# Patient Record
Sex: Male | Born: 1955
Health system: Western US, Academic
[De-identification: ages and names within clinical notes are randomized; demographics above are authoritative.]

## PROBLEM LIST (undated history)

## (undated) HISTORY — PX: EYE SURGERY: SHX253

---

## 2004-12-15 ENCOUNTER — Encounter: Admission: RE | Admit: 2004-12-15 | Discharge: 2004-12-15 | Payer: Self-pay | Admitting: Gastroenterology

## 2006-08-29 IMAGING — US US ABDOMEN COMPLETE
1 series · 14 of 25 positions shown · non-contrast
Comparison: None.

CLINICAL DATA: Elevated liver function studies.  
 COMPLETE ABDOMINAL ULTRASOUND:
TECHNIQUE: Complete abdominal ultrasound examination was performed including evaluation of the liver, gallbladder, bile ducts, pancreas, kidneys, spleen, IVC, and abdominal aorta.

[Series 1: unknown · 14 of 57 slices shown]
[im 1/57]
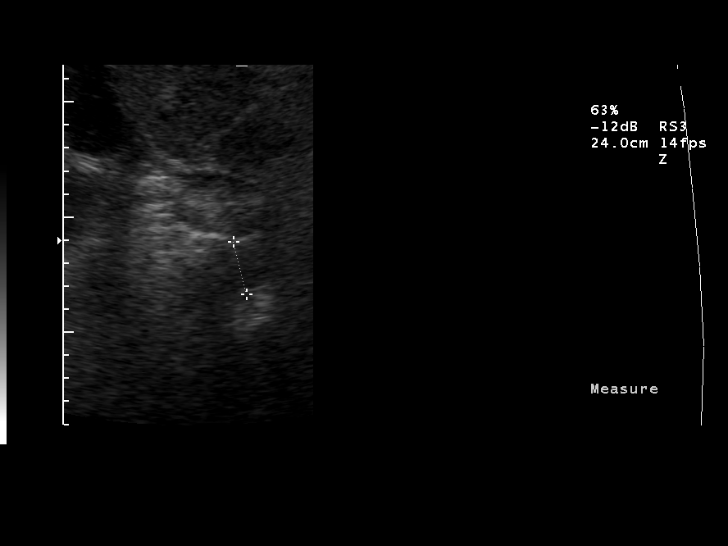
[im 5/57]
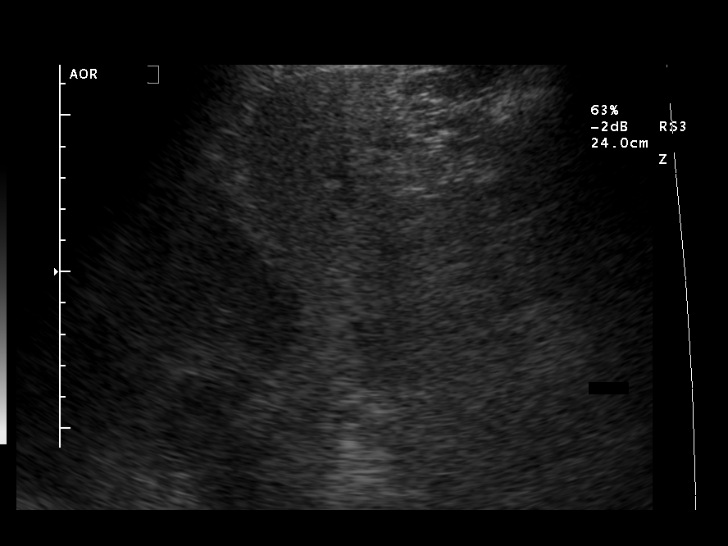
[im 10/57]
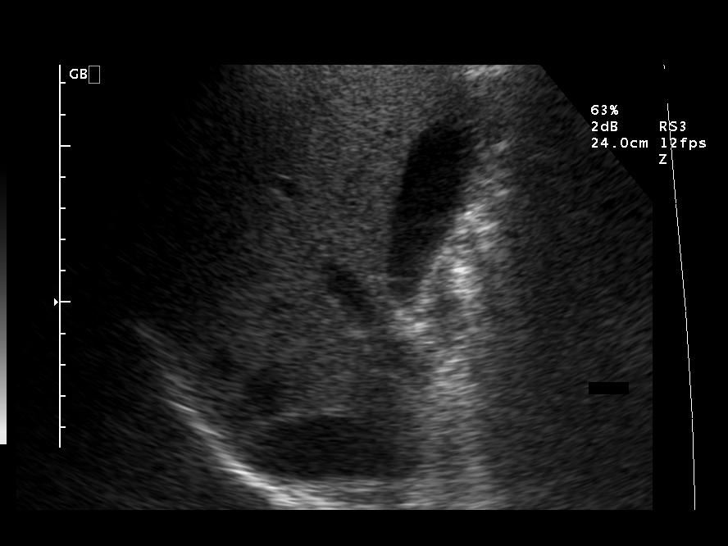
[im 15/57]
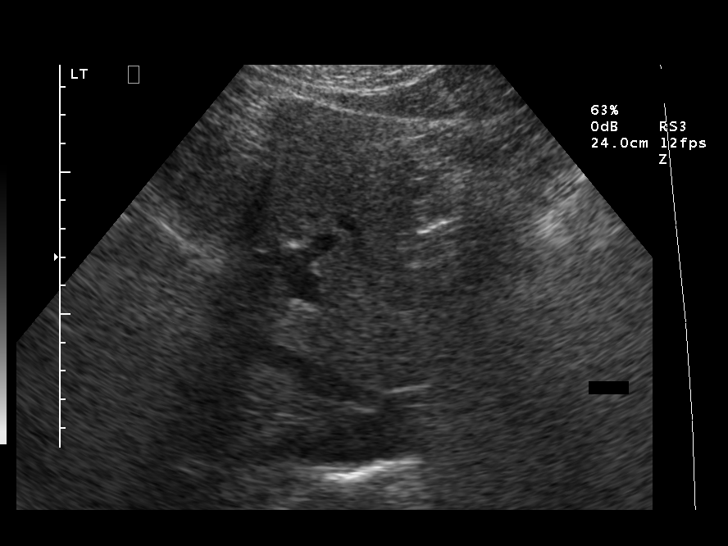
[im 19/57]
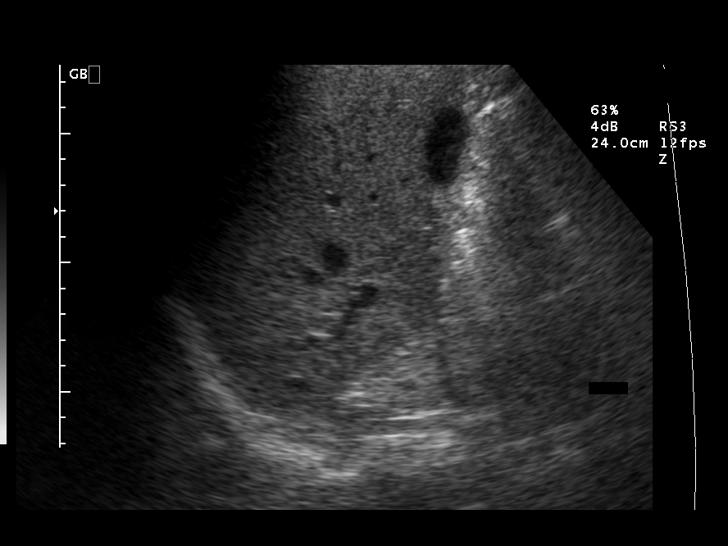
[im 22/57]
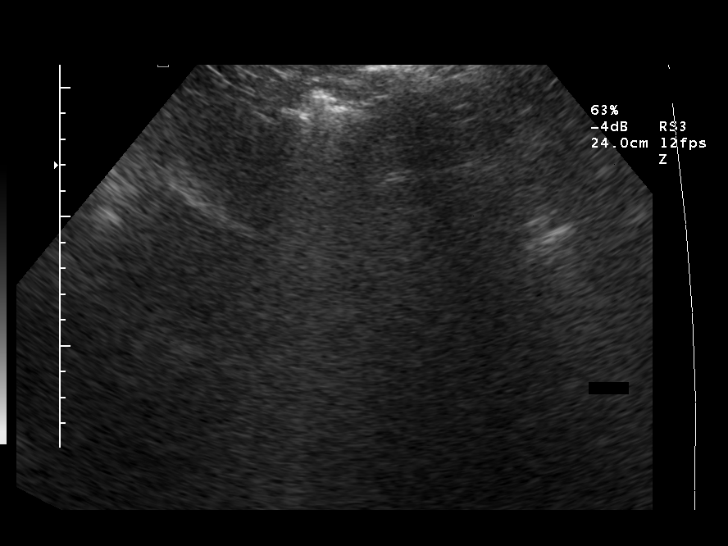
[im 26/57]
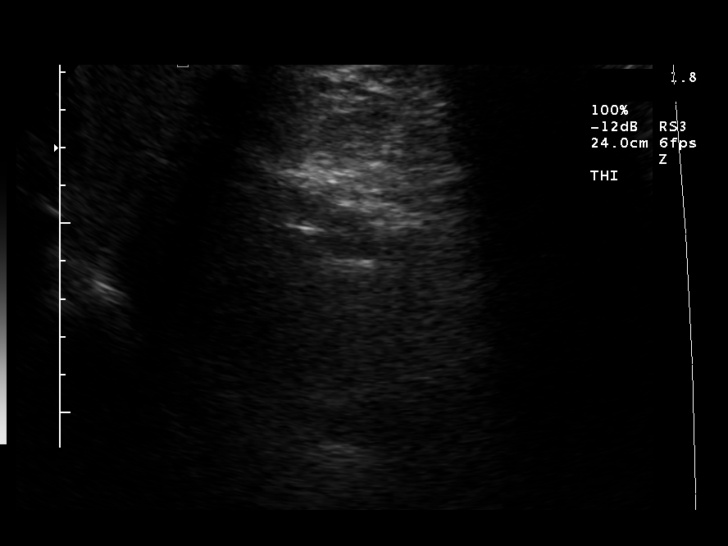
[im 31/57]
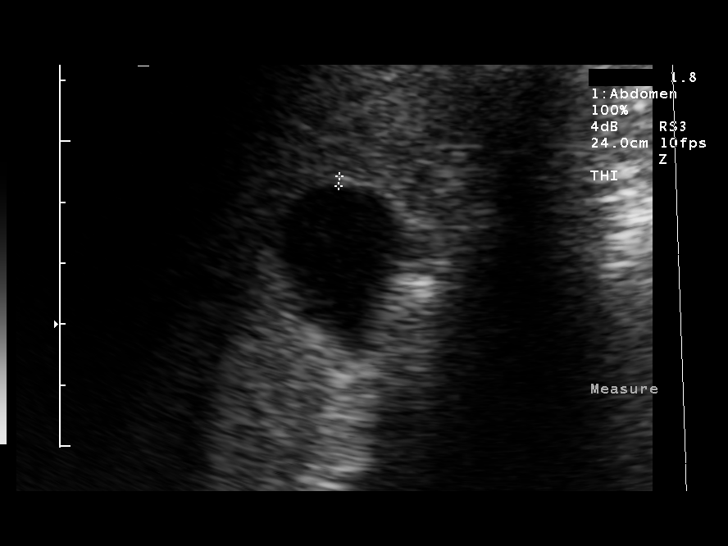
[im 36/57]
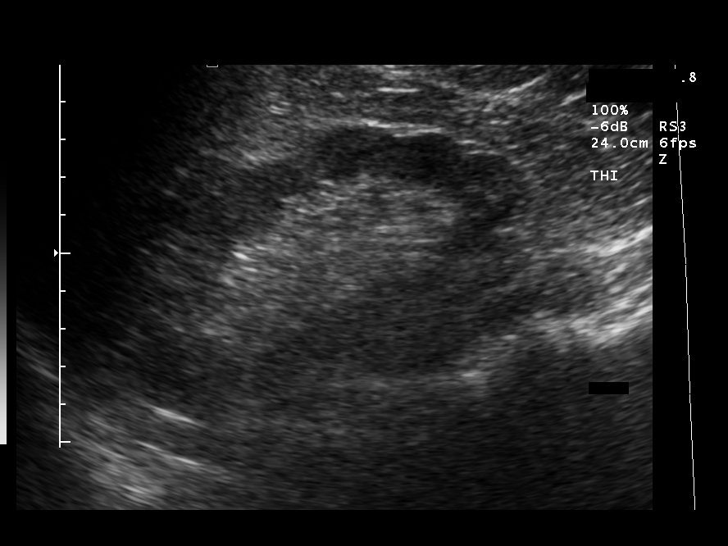
[im 38/57]
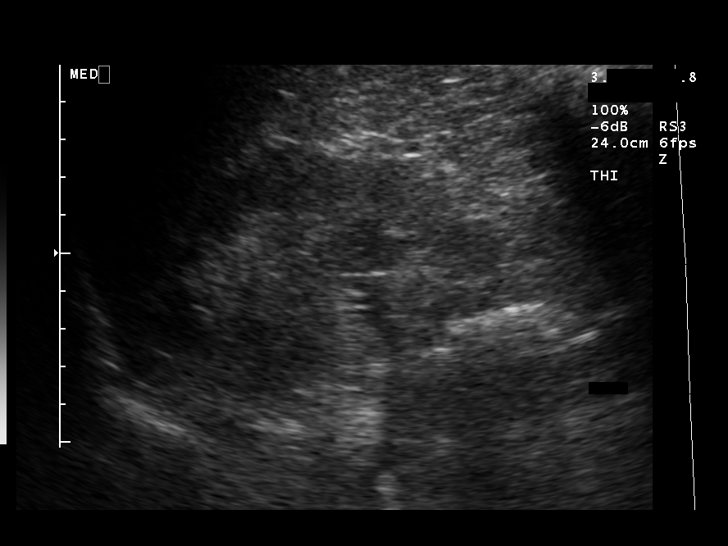
[im 43/57]
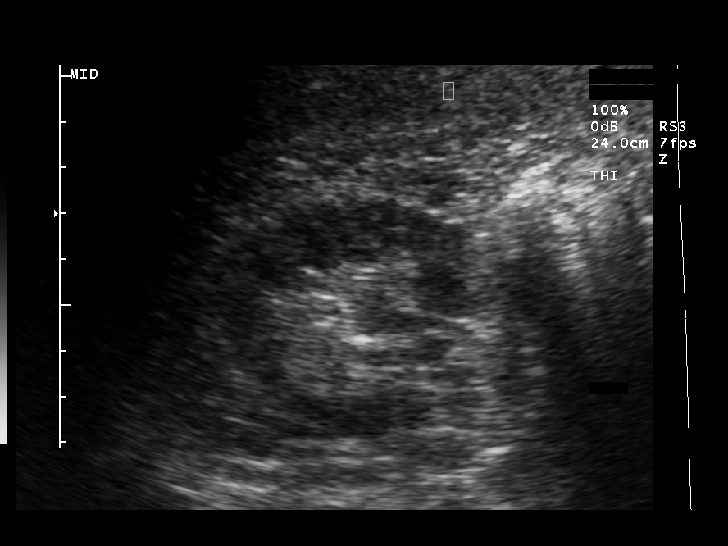
[im 47/57]
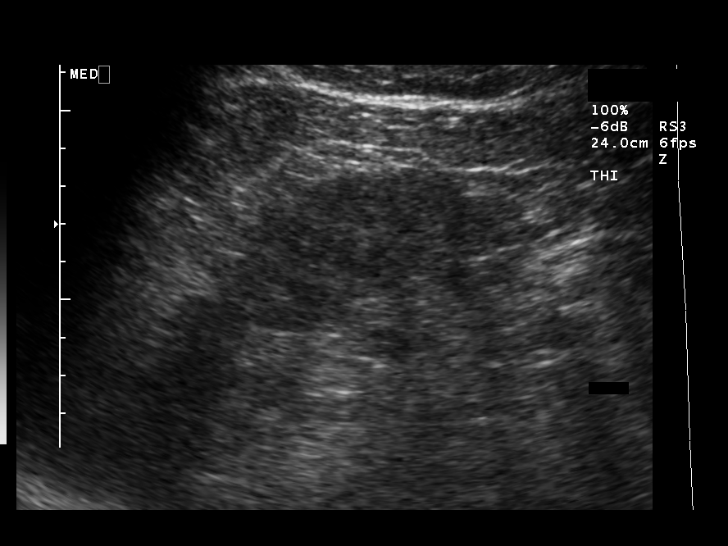
[im 52/57]
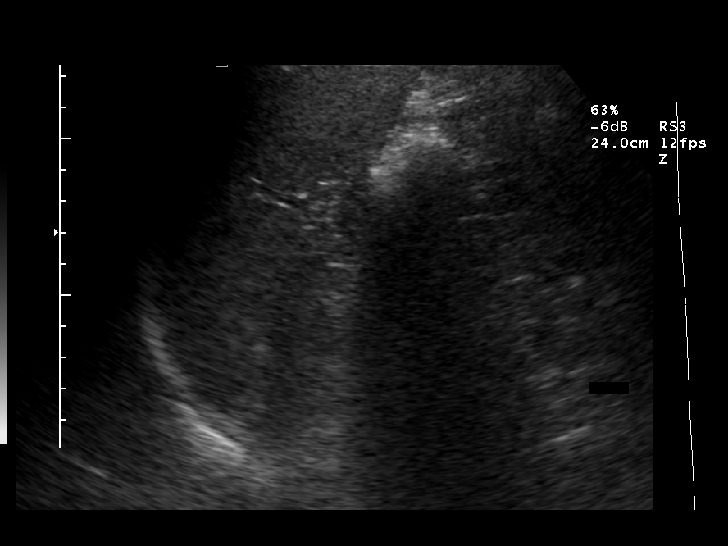
[im 57/57]
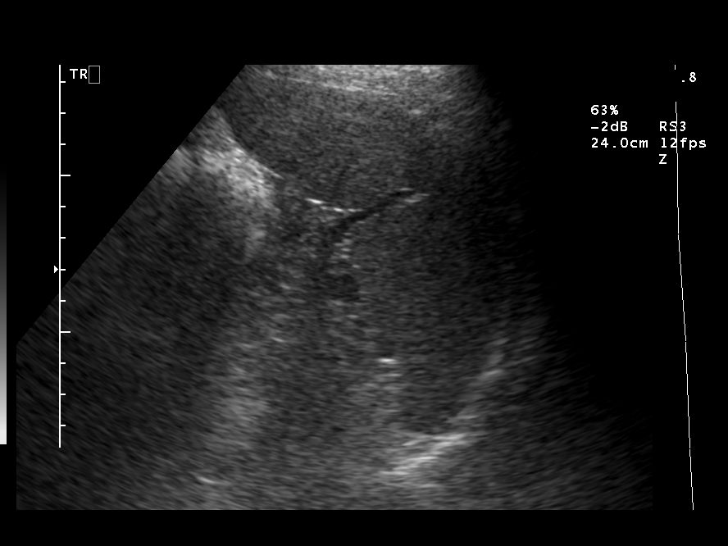

[14 of 25 positions shown; findings below may reference images not displayed]

FINDINGS: No gallstones or wall thickening.  No intra or extrahepatic biliary dilatation.  Common duct 3.6mm.  
 The liver shows diffuse increase in echogenicity without focal lesions.  This is most commonly seen with fatty infiltration but also can be seen in diffuse hepatocellular disease.  The liver does not appear to be significantly enlarged.  Spleen is normal.  The pancreas is essentially obscured by overlying bowel gas.  Kidney size is normal without stones or obstruction.  IVC and aorta normal.
IMPRESSION: 1.  Diffuse increased echogenicity of the liver ? see above.  
 2.  Pancreas obscured by bowel gas. 
 3.  Otherwise normal.

## 2012-02-04 ENCOUNTER — Encounter (HOSPITAL_COMMUNITY): Payer: Self-pay | Admitting: Cardiology

## 2012-02-04 ENCOUNTER — Emergency Department (HOSPITAL_COMMUNITY)
Admission: EM | Admit: 2012-02-04 | Discharge: 2012-02-04 | Disposition: A | Discharge status: Home / Self Care | Payer: 59 | Attending: Emergency Medicine | Admitting: Emergency Medicine

## 2012-02-04 ENCOUNTER — Emergency Department (HOSPITAL_COMMUNITY): Payer: 59

## 2012-02-04 DIAGNOSIS — S61419A Laceration without foreign body of unspecified hand, initial encounter: Secondary | ICD-10-CM

## 2012-02-04 DIAGNOSIS — Y9389 Activity, other specified: Secondary | ICD-10-CM | POA: Insufficient documentation

## 2012-02-04 DIAGNOSIS — S61209A Unspecified open wound of unspecified finger without damage to nail, initial encounter: Secondary | ICD-10-CM | POA: Insufficient documentation

## 2012-02-04 DIAGNOSIS — Y929 Unspecified place or not applicable: Secondary | ICD-10-CM | POA: Insufficient documentation

## 2012-02-04 DIAGNOSIS — W260XXA Contact with knife, initial encounter: Secondary | ICD-10-CM | POA: Insufficient documentation

## 2012-02-04 DIAGNOSIS — W261XXA Contact with sword or dagger, initial encounter: Secondary | ICD-10-CM | POA: Insufficient documentation

## 2012-02-04 MED ORDER — CEPHALEXIN 500 MG PO CAPS: 500.0000 mg | ORAL_CAPSULE | Freq: Two times a day (BID) | ORAL | Status: DC

## 2012-02-04 MED ORDER — CEPHALEXIN 250 MG PO CAPS
500.0000 mg | ORAL_CAPSULE | Freq: Once | ORAL | Status: AC
  Administered 2012-02-04: 500 mg via ORAL
  Filled 2012-02-04: qty 2

## 2012-02-04 MED ORDER — TRAMADOL HCL 50 MG PO TABS: 50.0000 mg | ORAL_TABLET | Freq: Four times a day (QID) | ORAL | Status: DC | PRN

## 2012-02-04 NOTE — ED Notes (Signed)
Paged ortho 

## 2012-02-04 NOTE — ED Notes (Addendum)
Ticket to ride given to pt/placed at bedside

## 2012-02-04 NOTE — ED Notes (Signed)
Pt given d/c teaching and prescriptions. Pt verbalized understanding of d/c teaching and prescriptions. Pt has splint applied from ortho tech. Pt has been given follow up care instructions and when to schedule surgery. Pt has no further questions upon d/c. Pt does not appear to be in acute distress upon d/c. Pt leaving ED with son.

## 2012-02-04 NOTE — ED Provider Notes (Signed)
Medical screening examination/treatment/procedure(s) were performed by non-physician practitioner and as supervising physician I was immediately available for consultation/collaboration.   Danyell Shader L Leetta Hendriks, MD 02/04/12 1833 

## 2012-02-04 NOTE — ED Notes (Signed)
Pt reports he cut his left thumb with a knife about 2 hours ago. States he went to [facility] and was sent here for further evaluation. Sensation intact to thumb. Bleeding controlled.

## 2012-02-04 NOTE — ED Notes (Signed)
Pt states that he was at Urgent Care and was told they were going to call the hand specialist on call

## 2012-02-04 NOTE — Progress Notes (Signed)
Orthopedic Tech Progress Note Patient Details:  Jared Donovan 1955/11/14 [id number]  Ortho Devices Type of Ortho Device: Finger splint Ortho Device/Splint Location: (L) UE Ortho Device/Splint Interventions: Application   Jennye Moccasin 02/04/2012, 5:26 PM

## 2012-02-04 NOTE — ED Provider Notes (Signed)
History  This chart was scribed for non-physician practitioner working with Jared Berkshire, MD by Ardeen Jourdain, ED Scribe. This patient was seen in room TR06C/TR06C and the patient's care was started at 1500.  CSN: [id number]  Arrival date & time 02/04/12  1359   First MD Initiated Contact with Patient 02/04/12 1500      Chief Complaint  Patient presents with  . Finger Injury     Patient is a 57 y.o. male presenting with skin laceration. The history is provided by the patient. No language interpreter was used.  Laceration  The incident occurred 3 to 5 hours ago. The laceration is located on the left hand. The laceration is 2 cm in size. The laceration mechanism was a a metal edge and a clean knife. The pain is mild. The pain has been constant since onset. He reports no foreign bodies present. His tetanus status is UTD.    Jared Donovan is a 57 y.o. male who presents to the Emergency Department complaining of a laceration to his left thumb that happened 3 hours ago. Pt states he was cutting wood with a pocket knife when he cut his finger. He denies any numbness or paraesthesia. He has associated reduced ROM with extension. Bleeding is controlled and pain is minimal.  Pt states he is pre-diabetic but denies any other h/o pertinent medical conditions. He states his last tetanus vaccination was in 2008.   History reviewed. No pertinent past medical history.  Past Surgical History  Procedure Date  . Eye surgery     History reviewed. No pertinent family history.  History  Substance Use Topics  . Smoking status: Never Smoker   . Smokeless tobacco: Not on file  . Alcohol Use: Yes      Review of Systems  Constitutional: Negative for fever.  Respiratory: Negative for shortness of breath.   Cardiovascular: Negative for chest pain.  Gastrointestinal: Negative for nausea, vomiting, abdominal pain and diarrhea.  Skin: Positive for wound.  All other systems reviewed and are  negative.    Allergies  Review of patient's allergies indicates no known allergies.  Home Medications   Current Outpatient Rx  Name  Route  Sig  Dispense  Refill  . OMEGA-3 FATTY ACIDS 1000 MG PO CAPS   Oral   Take 2 g by mouth daily.         Marland Kitchen LEVOTHYROXINE SODIUM 88 MCG PO TABS   Oral   Take 44 mcg by mouth daily.         Marland Kitchen GLUCOSAMINE FORTE PO   Oral   Take 2,000 mg by mouth daily.         Marland Kitchen PRESCRIPTION MEDICATION   Oral   Take 0.5 tablets by mouth 3 (three) times a week. Received sample of cholestrol medication from family physician. Unsure of name.           Triage Vitals: BP 125/73  Pulse 54  Temp 97.8 F (36.6 C) (Oral)  Resp 18  SpO2 97%  Physical Exam  Nursing note and vitals reviewed. Constitutional: He is oriented to person, place, and time. He appears well-developed and well-nourished. No distress.  HENT:  Head: Normocephalic.  Eyes: Conjunctivae normal and EOM are normal.  Cardiovascular: Normal rate.        Capillary refill less than 2 seconds   Pulmonary/Chest: Effort normal. No stridor.  Musculoskeletal: Normal range of motion.  Neurological: He is alert and oriented to person, place, and time.  Skin: Skin  is warm and dry.       Full thickness 3.5 cm laceration to left first digit at the dorsal MTP,  no active ROM in extension of MTP or IP. Gross distal sensation intact.  No penetration into the joint capsule or bony exposure  Psychiatric: He has a normal mood and affect.    ED Course  Procedures (including critical care time)  LACERATION REPAIR Performed by: Wynetta Emery Authorized by: Wynetta Emery Consent: Verbal consent obtained. Risks and benefits: risks, benefits and alternatives were discussed Consent given by: patient Patient identity confirmed: Wrist band  Prepped and Draped in normal sterile fashion  Tetanus: 2008   Laceration Location: Left hand: Dorsum of the first digit at the MTP  Laceration Length:  3.5 cm  Anesthesia: Local   Local anesthetic: 2% without epinephrine  Anesthetic total: 4 ml  Irrigation method: syringe  Amount of cleaning: copious   Wound explored to depth in good light on a bloodless field with no foreign bodies seen or palpated.   Skin closure: 4-0 polypropylene   Number of sutures: 4   Technique: Running locking   Patient tolerance: Patient tolerated the procedure well with no immediate complications.  Antibx ointment applied. Instructions for care discussed verbally and patient provided with additional written instructions for homecare and f/u.  DIAGNOSTIC STUDIES: Oxygen Saturation is 97% on room air, normal by my interpretation.    COORDINATION OF CARE:  3:14 PM: Discussed treatment plan which includes laceration repair with pt at bedside and pt agreed to plan.     Labs Reviewed - No data to display Dg Finger Thumb Left  02/04/2012  *RADIOLOGY REPORT*  Clinical Data: Cut left thumb with knife  LEFT THUMB 2+V  Comparison: None.  Findings: No fracture or dislocation is seen.  The joint spaces are preserved.  Gauze/bandage overlying the MCP joint.  No radiopaque foreign body is seen.  IMPRESSION: No fracture, dislocation, or radiopaque foreign body is seen.   Original Report Authenticated By: Charline Bills, M.D.      1. Laceration of hand involving extensor tendon       MDM   Consultation from hand surgeon Dr. Mina Marble appreciated: He states that the wound should be closed in the ED and the patient can follow with him next Monday for tendon repair. Patient opts for surgery on Monday. I have advised him that he needs to stay n.p.o. after midnight on Sunday and to call the office at 8:30 AM on Monday forscheduling.  Wound sutured shut with a running locking polypropylene sutures. Static splint applied.    Pt verbalized understanding and agrees with care plan. Outpatient follow-up and return precautions given.    New Prescriptions    CEPHALEXIN (KEFLEX) 500 MG CAPSULE    Take 1 capsule (500 mg total) by mouth 2 (two) times daily.   TRAMADOL (ULTRAM) 50 MG TABLET    Take 1 tablet (50 mg total) by mouth every 6 (six) hours as needed for pain.    I personally performed the services described in this documentation, which was scribed in my presence. The recorded information has been reviewed and is accurate.      Wynetta Emery, PA-C 02/04/12 1730

## 2013-10-18 IMAGING — CR DG FINGER THUMB 2+V*L*
3 series · 3 of 3 positions shown · non-contrast
Comparison: None.

CLINICAL DATA: Cut left thumb with knife

LEFT THUMB 2+V

[x finger pa left]
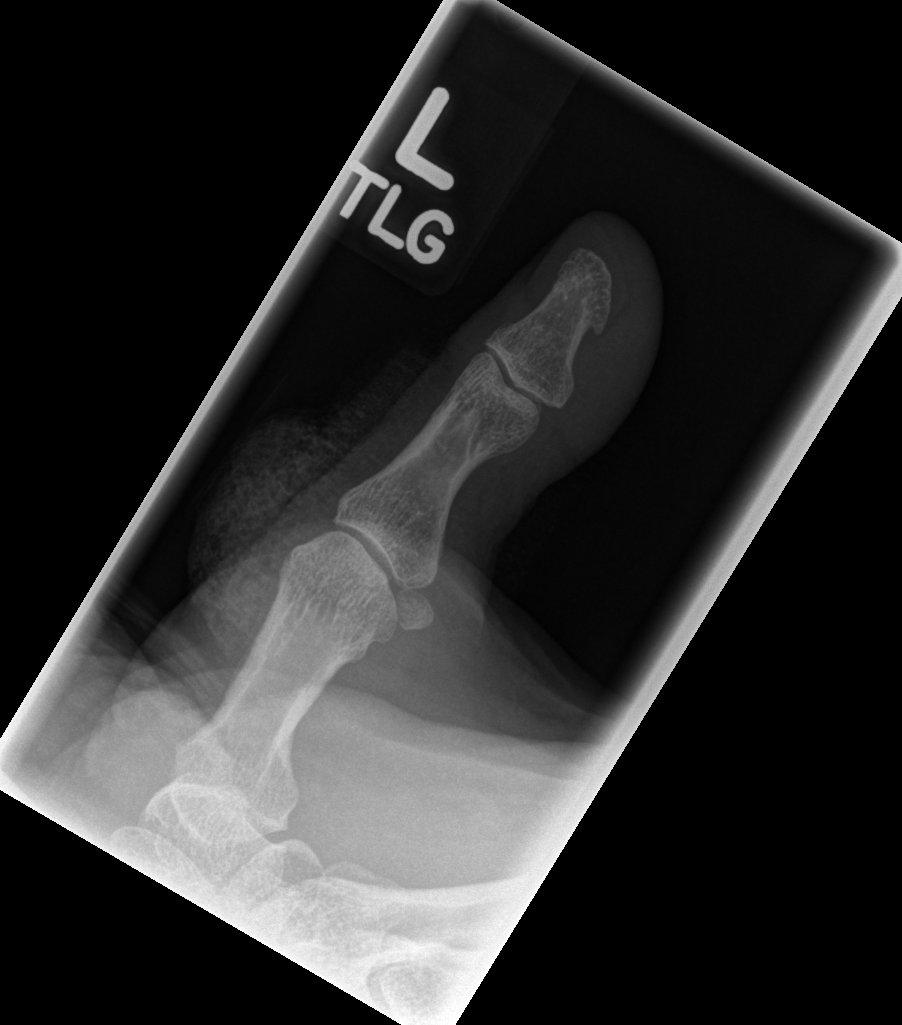

[x finger obl. left]
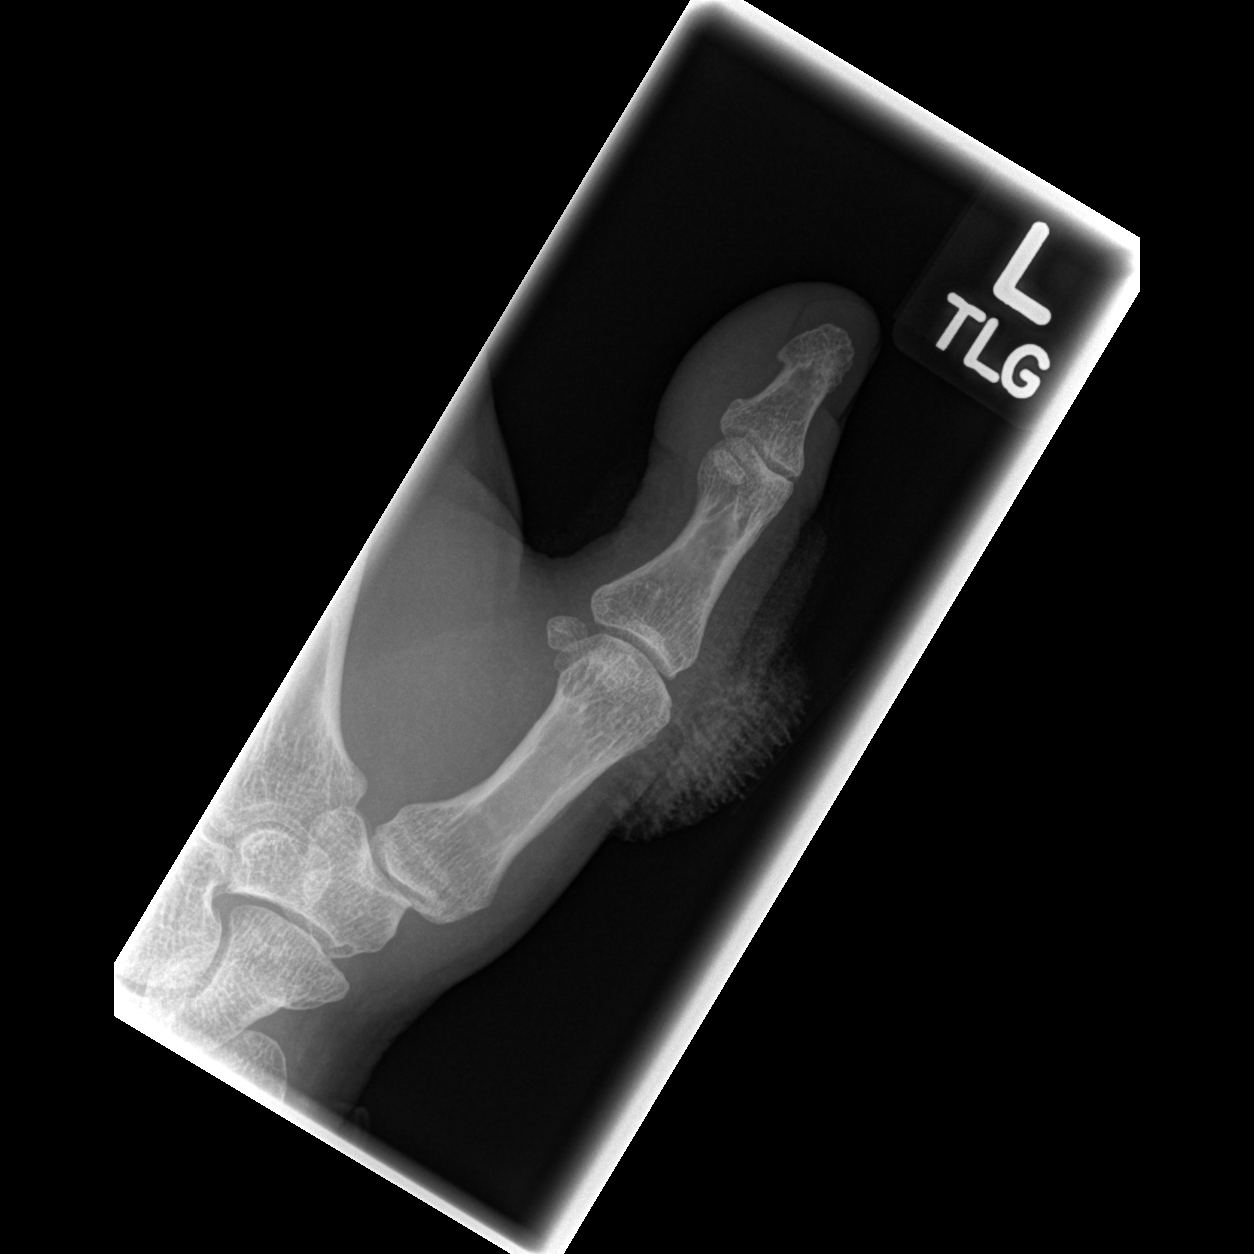

[x finger lateral left]
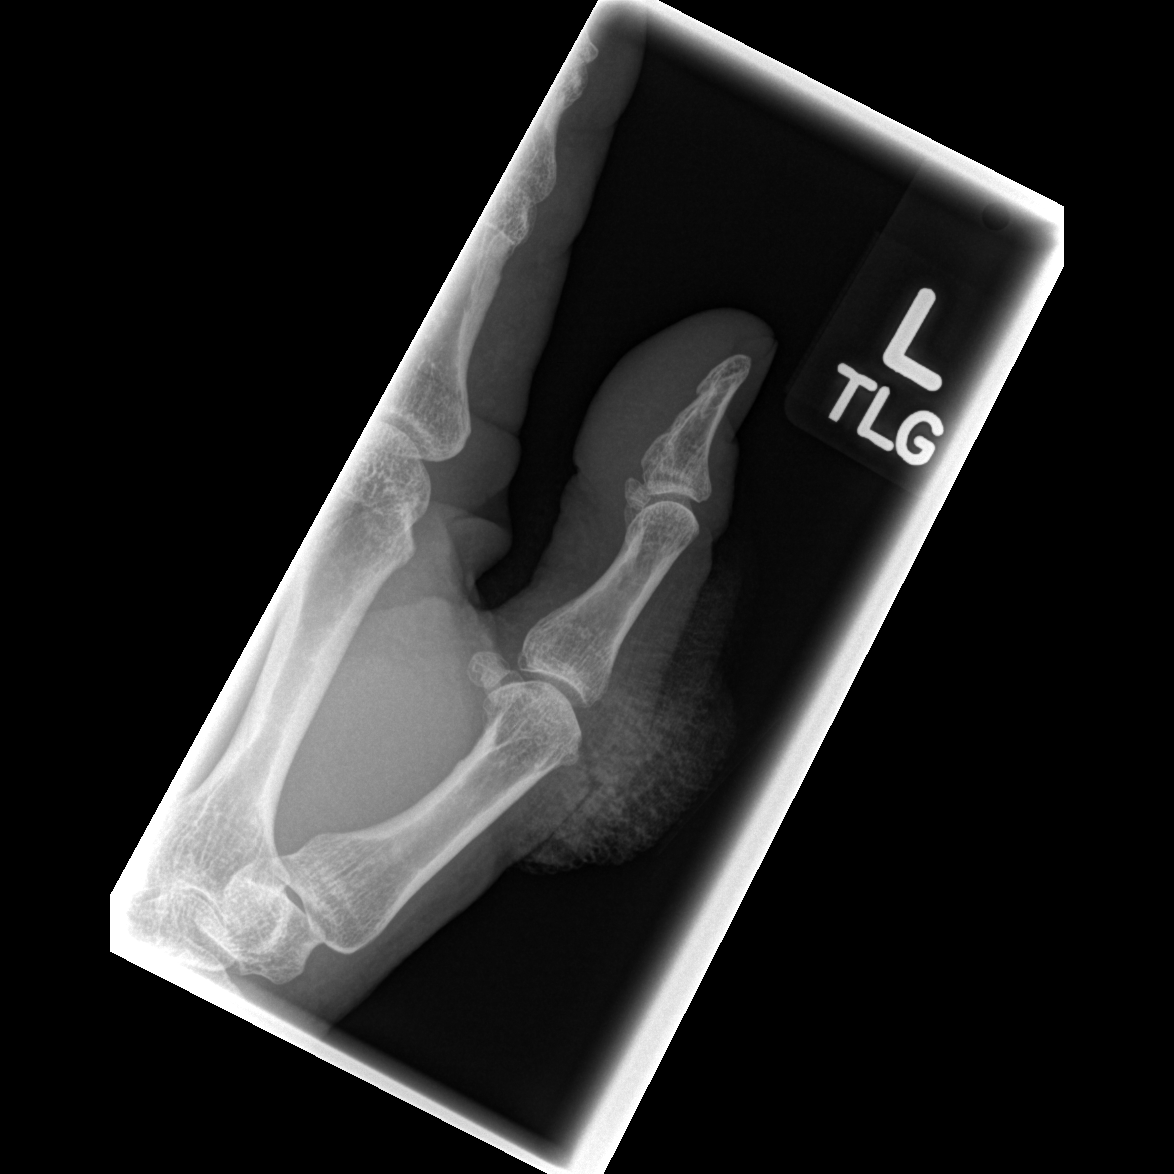

[3 of 3 positions shown; findings below may reference images not displayed]

FINDINGS: No fracture or dislocation is seen.

The joint spaces are preserved.

Gauze/bandage overlying the MCP joint.

No radiopaque foreign body is seen.
IMPRESSION: No fracture, dislocation, or radiopaque foreign body is seen.

## 2015-04-16 ENCOUNTER — Encounter: Payer: Self-pay | Admitting: Sports Medicine

## 2015-04-16 ENCOUNTER — Ambulatory Visit (INDEPENDENT_AMBULATORY_CARE_PROVIDER_SITE_OTHER): Payer: BLUE CROSS/BLUE SHIELD | Admitting: Sports Medicine

## 2015-04-16 VITALS — BP 113/77 | Ht 69.0 in | Wt 193.0 lb

## 2015-04-16 DIAGNOSIS — M19019 Primary osteoarthritis, unspecified shoulder: Secondary | ICD-10-CM | POA: Insufficient documentation

## 2015-04-16 DIAGNOSIS — M259 Joint disorder, unspecified: Secondary | ICD-10-CM | POA: Diagnosis not present

## 2015-04-16 MED ORDER — TRIAMCINOLONE ACETONIDE 10 MG/ML IJ SUSP
10.0000 mg | Freq: Once | INTRAMUSCULAR | Status: AC
  Administered 2015-04-16: 10 mg via INTRA_ARTICULAR

## 2015-04-16 NOTE — Progress Notes (Signed)
   Subjective:    Patient ID: Jared Donovan, male    DOB: 10/26/1955, 60 y.o.   MRN: [id number]  HPI  [age] yo male with hypothyroidism, seasonal allergies, known Cspine DJD, presents for evaluation of right shoulder pain.  [place] He slept on the stairwell and landed on the right elbow. Started having pain initially on the tricep area then in the shoulder joint.  It's aching pain, lasts all day, even wakes him up from sleep few times a night. Pain is not changing much. Not sure what aggravates the pain, has continued to do physical activities such as yard work and rowing exercises at the gym without much trouble. Takes ibuprofen occasionally for the pain which helps some. No weakness, numbness, tingling on the right hand. Had similar injury to his shoulder in the past once many years ago.   Has some numbness/tingling on left 4th and 5th digits chronically.   Works - Public relations account executiveengineering related  Past Hx: cholesterol high/ hypothyroid   Review of Systems  Constitutional: Negative for fever and chills.  Respiratory: Negative for chest tightness, shortness of breath and wheezing.   Cardiovascular: Negative for chest pain, palpitations and leg swelling.  Musculoskeletal: Positive for arthralgias. Negative for neck pain.  Neurological: Negative for dizziness, weakness, numbness and headaches.       Objective:   Physical Exam  Constitutional: He is oriented to person, place, and time. He appears well-developed and well-nourished.  HENT:  Head: Normocephalic.  Musculoskeletal:   Some pain with external rotationand internal rotation. Positive O'Brien's test. Has TTP over [facility]. Normal rotator cuff strength.   Neurological: He is alert and oriented to person, place, and time. No cranial nerve deficit.    Filed Vitals:   04/16/15 1035  BP: 113/77   US Did right shoulder ultrasound which showed AC joint effusion with calcification likely  from prior injury. ( mushroom sign.)  Performed corticosteroid injection to the [facility].  Procedure:  Injection of RT [facility] Consent obtained and verified. Time-out conducted. Noted no overlying erythema, induration, or other signs of local infection. Skin prepped in a sterile fashion. Topical analgesic spray:none. Completed without difficulty.  US guided into the pseudo-bursal pouch Meds: Pain immediately improved suggesting accurate placement of the medication. Advised to call if fevers/chills, erythema, induration, drainage, or persistent bleeding.  Vagal reaction post injection  Stable post 10 mins          Assessment & Plan:  See problem based a&p.

## 2015-04-16 NOTE — Assessment & Plan Note (Addendum)
Right shoulder pain after falling 10 weeks ago. On ultra sound there is small effusion on the [facility] along with calcification likely from his previous shoulder injury.   We did steroid injection. Recommended ice and ibuprofen. Asked to avoid activities that causes pain but otherwise encouraged physical activity.  RTC PRN.   PRN Nsaid use

## 2015-07-10 ENCOUNTER — Ambulatory Visit (INDEPENDENT_AMBULATORY_CARE_PROVIDER_SITE_OTHER): Payer: BLUE CROSS/BLUE SHIELD | Admitting: Sports Medicine

## 2015-07-10 ENCOUNTER — Encounter: Payer: Self-pay | Admitting: Sports Medicine

## 2015-07-10 ENCOUNTER — Encounter (INDEPENDENT_AMBULATORY_CARE_PROVIDER_SITE_OTHER): Payer: Self-pay

## 2015-07-10 VITALS — BP 113/65 | HR 53 | Ht 69.0 in | Wt 193.0 lb

## 2015-07-10 DIAGNOSIS — M259 Joint disorder, unspecified: Secondary | ICD-10-CM | POA: Diagnosis not present

## 2015-07-10 DIAGNOSIS — M7541 Impingement syndrome of right shoulder: Secondary | ICD-10-CM | POA: Diagnosis not present

## 2015-07-10 DIAGNOSIS — M19019 Primary osteoarthritis, unspecified shoulder: Secondary | ICD-10-CM

## 2015-07-10 NOTE — Assessment & Plan Note (Signed)
This seems less painful Smaller effusion noted on ultrasound

## 2015-07-10 NOTE — Assessment & Plan Note (Signed)
I started him on scapular stabilization exercises  Periodic use of ibuprofen  Be consistent with these over the next 3 months but if the symptoms don't resolve we will need x-ray and MRI

## 2015-07-10 NOTE — Progress Notes (Signed)
Patient ID: Jared Donovan, male   DOB: 11/16/1955, 60 y.o.   MRN: [id number]  Chief complaint right shoulder pain  Patient seen 3 months ago with a.c. Joint arthritis and an effusion Injection seemed to relieve her pain for only a few days He did get relief from night pain and is able to sleep through the night  However, he continues to get a sharp pain when reaching his right arm out and behind him This pain is intense and can be nauseating He does not have to be lifting anything  Past history of FOOSH on RT while skiing More recent fall onto the right elbow jamming the right shoulder  Review of systems Denies neck pain Denies radiculopathy  Physical examination Muscular male in no acute distress BP 113/65 mmHg  Pulse 53  Ht 5\' 9"  (1.753 m)  Wt 193 lb (87.544 kg)  BMI 28.49 kg/m2  Shoulder: Inspection reveals no abnormalities, atrophy or asymmetry. Palpation  With mild tenderness over AC joint  + Xover test None over bicipital groove. ROM is full in all planes. Rotator cuff strength normal throughout. + signs of impingement with  Neer and Hawkin's tests, empty can. Speeds and Yergason's tests normal. No labral pathology noted with negative Obrien's, negative clunk and good stability. Normal scapular function observed. No painful arc and no drop arm sign. No apprehension sign  MSK ultrasound A.c. Joint shows mild arthritis but much less effusion than before Subscapularis, infraspinatus and teres minor are normal Supraspinatous tendon is normal but on impingement testing does seem to impinge Bicipital tendon normal There is some superior irregularity of the humeral head Shoulder joint shows no effusion

## 2017-06-04 DEATH — deceased

## 2018-06-13 ENCOUNTER — Other Ambulatory Visit: Payer: Self-pay | Admitting: Internal Medicine

## 2018-06-13 DIAGNOSIS — Z1159 Encounter for screening for other viral diseases: Secondary | ICD-10-CM

## 2018-06-13 LAB — COVID-2019 RNA, QUAL: SARS-CoV-2: NOT DETECTED

## 2019-10-09 ENCOUNTER — Other Ambulatory Visit: Payer: Self-pay

## 2019-10-09 DIAGNOSIS — E1140 Type 2 diabetes mellitus with diabetic neuropathy, unspecified: Secondary | ICD-10-CM

## 2019-10-09 LAB — BASIC METABOLIC PANEL
Calcium: 9.1 mg/dL (ref 8.6–10.5)
Carbon Dioxide Total: 25 mmol/L (ref 24–32)
Chloride: 102 mmol/L (ref 95–110)
Creatinine Serum: 1.46 mg/dL — ABNORMAL HIGH (ref 0.44–1.27)
E-GFR Creatinine (Male): 50 mL/min/{1.73_m2}
Glucose: 105 mg/dL — ABNORMAL HIGH (ref 70–99)
Potassium: 4.6 mmol/L (ref 3.3–5.0)
Sodium: 138 mmol/L (ref 135–145)
Urea Nitrogen, Blood (BUN): 17 mg/dL (ref 8–22)

## 2019-10-09 LAB — CBC WITH DIFFERENTIAL
Basophils % Auto: 0.6 %
Basophils Abs Auto: 0 10*3/uL (ref 0.0–0.2)
Eosinophils % Auto: 2.5 %
Eosinophils Abs Auto: 0.2 10*3/uL (ref 0.0–0.5)
Hematocrit: 38.2 % — ABNORMAL LOW (ref 41.0–53.0)
Hemoglobin: 12.5 g/dL — ABNORMAL LOW (ref 13.5–17.5)
Lymphocytes % Auto: 27.4 %
Lymphocytes Abs Auto: 1.8 10*3/uL (ref 1.0–4.8)
MCH: 28.9 pg (ref 27.0–33.0)
MCHC: 32.7 % (ref 32.0–36.0)
MCV: 88.4 fL (ref 80.0–100.0)
MPV: 10.1 fL — ABNORMAL HIGH (ref 6.8–10.0)
Monocytes % Auto: 8.8 %
Monocytes Abs Auto: 0.6 10*3/uL (ref 0.1–0.8)
Neutrophils % Auto: 60.7 %
Neutrophils Abs Auto: 4 10*3/uL (ref 1.8–7.7)
Platelet Count: 186 10*3/uL (ref 130–400)
RDW: 14.7 % (ref 0.0–14.7)
Red Blood Cell Count: 4.33 10*6/uL — ABNORMAL LOW (ref 4.50–5.90)
White Blood Cell Count: 6.6 10*3/uL (ref 4.5–11.0)

## 2021-01-07 ENCOUNTER — Encounter (HOSPITAL_BASED_OUTPATIENT_CLINIC_OR_DEPARTMENT_OTHER): Payer: Self-pay | Admitting: Radiology

## 2021-01-07 ENCOUNTER — Emergency Department (HOSPITAL_BASED_OUTPATIENT_CLINIC_OR_DEPARTMENT_OTHER): Payer: No Typology Code available for payment source

## 2021-01-07 ENCOUNTER — Emergency Department (HOSPITAL_BASED_OUTPATIENT_CLINIC_OR_DEPARTMENT_OTHER)
Admission: EM | Admit: 2021-01-07 | Discharge: 2021-01-07 | Disposition: A | Discharge status: Home / Self Care | Payer: No Typology Code available for payment source | Attending: Emergency Medicine | Admitting: Emergency Medicine

## 2021-01-07 ENCOUNTER — Other Ambulatory Visit: Payer: Self-pay

## 2021-01-07 DIAGNOSIS — R109 Unspecified abdominal pain: Secondary | ICD-10-CM | POA: Diagnosis present

## 2021-01-07 DIAGNOSIS — N200 Calculus of kidney: Secondary | ICD-10-CM | POA: Insufficient documentation

## 2021-01-07 DIAGNOSIS — Z7982 Long term (current) use of aspirin: Secondary | ICD-10-CM | POA: Diagnosis not present

## 2021-01-07 DIAGNOSIS — R001 Bradycardia, unspecified: Secondary | ICD-10-CM | POA: Insufficient documentation

## 2021-01-07 LAB — CBC WITH DIFFERENTIAL/PLATELET
Abs Immature Granulocytes: 0.02 10*3/uL (ref 0.00–0.07)
Basophils Absolute: 0 10*3/uL (ref 0.0–0.1)
Basophils Relative: 0 %
Eosinophils Absolute: 0 10*3/uL (ref 0.0–0.5)
Eosinophils Relative: 0 %
HCT: 46.8 % (ref 39.0–52.0)
Hemoglobin: 16.1 g/dL (ref 13.0–17.0)
Immature Granulocytes: 0 %
Lymphocytes Relative: 10 %
Lymphs Abs: 0.9 10*3/uL (ref 0.7–4.0)
MCH: 31.1 pg (ref 26.0–34.0)
MCHC: 34.4 g/dL (ref 30.0–36.0)
MCV: 90.3 fL (ref 80.0–100.0)
Monocytes Absolute: 0.4 10*3/uL (ref 0.1–1.0)
Monocytes Relative: 5 %
Neutro Abs: 8.1 10*3/uL — ABNORMAL HIGH (ref 1.7–7.7)
Neutrophils Relative %: 85 %
Platelets: 219 10*3/uL (ref 150–400)
RBC: 5.18 MIL/uL (ref 4.22–5.81)
RDW: 12.2 % (ref 11.5–15.5)
WBC: 9.6 10*3/uL (ref 4.0–10.5)
nRBC: 0 % (ref 0.0–0.2)

## 2021-01-07 LAB — HEPATIC FUNCTION PANEL
ALT: 17 U/L (ref 0–44)
AST: 18 U/L (ref 15–41)
Albumin: 4.2 g/dL (ref 3.5–5.0)
Alkaline Phosphatase: 65 U/L (ref 38–126)
Bilirubin, Direct: 0.2 mg/dL (ref 0.0–0.2)
Indirect Bilirubin: 0.9 mg/dL (ref 0.3–0.9)
Total Bilirubin: 1.1 mg/dL (ref 0.3–1.2)
Total Protein: 6.9 g/dL (ref 6.5–8.1)

## 2021-01-07 LAB — URINALYSIS, ROUTINE W REFLEX MICROSCOPIC
Bilirubin Urine: NEGATIVE
Glucose, UA: NEGATIVE mg/dL
Hgb urine dipstick: NEGATIVE
Ketones, ur: NEGATIVE mg/dL
Leukocytes,Ua: NEGATIVE
Nitrite: NEGATIVE
Protein, ur: NEGATIVE mg/dL
Specific Gravity, Urine: 1.018 (ref 1.005–1.030)
pH: 6.5 (ref 5.0–8.0)

## 2021-01-07 LAB — LIPASE, BLOOD: Lipase: 15 U/L (ref 11–51)

## 2021-01-07 LAB — BASIC METABOLIC PANEL
Anion gap: 8 (ref 5–15)
BUN: 19 mg/dL (ref 8–23)
CO2: 26 mmol/L (ref 22–32)
Calcium: 9.2 mg/dL (ref 8.9–10.3)
Chloride: 104 mmol/L (ref 98–111)
Creatinine, Ser: 1.47 mg/dL — ABNORMAL HIGH (ref 0.61–1.24)
GFR, Estimated: 52 mL/min — ABNORMAL LOW (ref >60)
Glucose, Bld: 109 mg/dL — ABNORMAL HIGH (ref 70–99)
Potassium: 4.2 mmol/L (ref 3.5–5.1)
Sodium: 138 mmol/L (ref 135–145)

## 2021-01-07 MED ORDER — IOHEXOL 300 MG/ML  SOLN
85.0000 mL | Freq: Once | INTRAMUSCULAR | Status: AC | PRN
  Administered 2021-01-07: 85 mL via INTRAVENOUS

## 2021-01-07 MED ORDER — LACTATED RINGERS IV BOLUS
1000.0000 mL | Freq: Once | INTRAVENOUS | Status: AC
  Administered 2021-01-07: 1000 mL via INTRAVENOUS

## 2021-01-07 MED ORDER — KETOROLAC TROMETHAMINE 15 MG/ML IJ SOLN
15.0000 mg | Freq: Once | INTRAMUSCULAR | Status: AC
Start: 2021-01-07 — End: 2021-01-07
  Administered 2021-01-07: 15 mg via INTRAVENOUS
  Filled 2021-01-07: qty 1

## 2021-01-07 MED ORDER — ONDANSETRON HCL 4 MG/2ML IJ SOLN
4.0000 mg | Freq: Once | INTRAMUSCULAR | Status: AC
  Administered 2021-01-07: 4 mg via INTRAVENOUS
  Filled 2021-01-07: qty 2

## 2021-01-07 NOTE — ED Notes (Signed)
Patient transported to CT 

## 2021-01-07 NOTE — ED Provider Notes (Signed)
[facility] EMERGENCY DEPT Provider Note   CSN: [id number] Arrival date & time: 01/07/21  0944     History  Chief Complaint  Patient presents with   Flank Pain    right    Jared Donovan is a 66 y.o. male.   Flank Pain   66 year old male with a remote history of nephrolithiasis who presents to the emergency department with sudden onset right flank pain.  The patient states that he woke from sleep with right-sided sharp shooting flank pain that was severe.  It felt like when he had a kidney stone 20 years ago.  He denies any dysuria or increased urinary frequency.  He denies any fevers or chills.  Pain is now more of a dull ache in his right lower quadrant.  He endorses nausea but no vomiting.  Home Medications Prior to Admission medications   Medication Sig Start Date End Date Taking? Authorizing Provider  aspirin EC 81 MG tablet Take by mouth.    [provider]  cephALEXin (KEFLEX) 500 MG capsule Take 1 capsule (500 mg total) by mouth 2 (two) times daily. 02/04/12   Pisciotta, Joni Reining, PA-C  fish oil-omega-3 fatty acids 1000 MG capsule Take 2 g by mouth daily.    [provider]  fluticasone (FLONASE) 50 MCG/ACT nasal spray Place into the nose. 03/21/15   [provider]  Glucosamine Sulfate 1000 MG CAPS Take by mouth.    [provider]  levothyroxine (SYNTHROID, LEVOTHROID) 88 MCG tablet Take 44 mcg by mouth daily.    [provider]  Nutritional Supplements (GLUCOSAMINE FORTE PO) Take 2,000 mg by mouth daily.    [provider]  Omega-3 1000 MG CAPS Take 1 g by mouth.    [provider]  predniSONE (DELTASONE) 20 MG tablet  03/12/15   [provider]  PRESCRIPTION MEDICATION Take 0.5 tablets by mouth 3 (three) times a week. Received sample of cholestrol medication from family physician. Unsure of name.    [provider]  rosuvastatin (CRESTOR) 20 MG tablet  12/18/14   [provider]  traMADol (ULTRAM) 50 MG tablet Take 1 tablet (50 mg total) by mouth every 6 (six) hours as needed for pain. 02/04/12   Pisciotta, Joni Reining, PA-C      Allergies    Patient has no known allergies.    Review of Systems   Review of Systems  Genitourinary:  Positive for flank pain.   Physical Exam Updated Vital Signs BP (!) 127/55 (BP Location: Right Arm)    Pulse (!) 49    Temp 98.1 F (36.7 C)    Resp 16    Ht 5\' 9"  (1.753 m)    Wt 88.5 kg    SpO2 100%    BMI 28.80 kg/m  Physical Exam Vitals and nursing note reviewed.  Constitutional:      General: He is not in acute distress. HENT:     Head: Normocephalic and atraumatic.  Eyes:     Conjunctiva/sclera: Conjunctivae normal.     Pupils: Pupils are equal, round, and reactive to light.  Cardiovascular:     Rate and Rhythm: Normal rate and regular rhythm.  Pulmonary:     Effort: Pulmonary effort is normal. No respiratory distress.  Abdominal:     General: There is no distension.     Tenderness: There is abdominal tenderness in the right lower quadrant. There is no right CVA tenderness, left CVA tenderness or guarding.  Comments: Mild right lower quadrant tenderness to palpation on exam, no rebound or guarding  Musculoskeletal:        General: No deformity or signs of injury.     Cervical back: Neck supple.  Skin:    Findings: No lesion or rash.  Neurological:     General: No focal deficit present.     Mental Status: He is alert. Mental status is at baseline.    ED Results / Procedures / Treatments   Labs (all labs ordered are listed, but only abnormal results are displayed) Labs Reviewed  CBC WITH DIFFERENTIAL/PLATELET - Abnormal; Notable for the following components:      Result Value   Neutro Abs 8.1 (*)    All other components within normal limits  BASIC METABOLIC PANEL - Abnormal; Notable for the following components:   Glucose, Bld 109 (*)    Creatinine, Ser 1.47 (*)    GFR, Estimated 52 (*)    All  other components within normal limits  URINALYSIS, ROUTINE W REFLEX MICROSCOPIC  LIPASE, BLOOD  HEPATIC FUNCTION PANEL    EKG None  Radiology CT ABDOMEN PELVIS W CONTRAST  Result Date: 01/07/2021 CLINICAL DATA:  Recurrent right flank pain. EXAM: CT ABDOMEN AND PELVIS WITH CONTRAST TECHNIQUE: Multidetector CT imaging of the abdomen and pelvis was performed using the standard protocol following bolus administration of intravenous contrast. CONTRAST:  82mL OMNIPAQUE IOHEXOL 300 MG/ML  SOLN COMPARISON:  None. FINDINGS: Lower chest: The lung bases are clear of acute process. No pleural effusion or pulmonary lesions. The heart is normal in size. No pericardial effusion. The distal esophagus and aorta are unremarkable. Hepatobiliary: No focal hepatic lesions or intrahepatic biliary dilatation. The gallbladder is normal. No common bile duct dilatation. Pancreas: No mass, inflammation or ductal dilatation. Spleen: Normal size.  No focal lesions. Adrenals/Urinary Tract: The adrenal glands are normal. No renal calculi are identified. There is mild to moderate right-sided hydroureteronephrosis down to an obstructing 4 mm calculus located at the right UVJ. No left-sided hydronephrosis or obstructing left ureteral calculus. No bladder calculi. Both kidneys demonstrate normal enhancement/perfusion. No worrisome renal lesions. The delayed images do not demonstrate any significant collecting system abnormalities. No bladder lesions are identified. Stomach/Bowel: The stomach, duodenum, small bowel and colon are unremarkable. No acute inflammatory changes, mass lesions or obstructive findings. Terminal ileum is normal. The appendix is normal. Scattered diverticulosis. Vascular/Lymphatic: Moderate age advanced atherosclerotic calcifications involving the aorta but no aneurysm or dissection. The branch vessels are patent. The major venous structures are patent. No mesenteric or retroperitoneal mass or adenopathy.  Reproductive: The prostate gland and seminal vesicles are unremarkable. Other: No pelvic mass or adenopathy. No free pelvic fluid collections. No inguinal mass or adenopathy. No abdominal wall hernia or subcutaneous lesions. Musculoskeletal: No significant bony findings. IMPRESSION: 1. 4 mm right UVJ calculus causing mild to moderate right-sided hydroureteronephrosis. 2. No renal calculi or worrisome renal lesions. 3. No other significant abdominal/pelvic findings, mass lesions or adenopathy. 4. Age advanced atherosclerotic calcifications involving the aorta. 5. Aortic atherosclerosis. Aortic Atherosclerosis (ICD10-I70.0). Electronically Signed   By: Marijo Sanes M.D.   On: 01/07/2021 14:29    Procedures Procedures    Medications Ordered in ED Medications  ondansetron (ZOFRAN) injection 4 mg (4 mg Intravenous Given 01/07/21 1250)  ketorolac (TORADOL) 15 MG/ML injection 15 mg (15 mg Intravenous Given 01/07/21 1250)  lactated ringers bolus 1,000 mL (0 mLs Intravenous Stopped 01/07/21 1400)  iohexol (OMNIPAQUE) 300 MG/ML solution 85 mL (85 mLs Intravenous  Contrast Given 01/07/21 1353)    ED Course/ Medical Decision Making/ A&P                           Medical Decision Making  66 year old male with a remote history of nephrolithiasis who presents to the emergency department with sudden onset right flank pain.  The patient states that he woke from sleep with right-sided sharp shooting flank pain that was severe.  It felt like when he had a kidney stone 20 years ago.  He denies any dysuria or increased urinary frequency.  He denies any fevers or chills.  Pain is now more of a dull ache in his right lower quadrant.  He endorses nausea but no vomiting.  On arrival, the patient was afebrile, mildly bradycardic, hemodynamically stable, saturating well on room air.  Mild sinus bradycardia noted on cardiac telemetry.  Patient presenting with severe right flank pain that woke him from sleep that caused him to be  writhing in pain earlier today.  Symptoms are concerning for possible nephrolithiasis.  He endorses mild dull ache at this time with mild right lower quadrant tenderness to palpation.  Additional differential consideration includes appendicitis, small bowel obstruction.  Initial urinalysis revealed no evidence of hematuria or urinary tract infection.  Laboratory work-up was obtained which revealed a normal hepatic function panel, normal lipase, BMP with a mild AKI with a creatinine of 1.47, CBC without a leukocytosis or anemia.  A CT abdomen pelvis was performed and reviewed by myself and radiology and significant for a 4 mm stone in the right UVJ at the time of imaging.  The patient was administered an IV fluid bolus, IV Toradol and IV Zofran with resolution in his pain and symptoms.  I informed the patient that given the size of the stone, it would likely pass on its own.  Advised high-dose NSAIDs and urology follow-up.  As stone is less than 5 mm, will hold on Flomax treatment.  Patient has no evidence of sepsis or UTI at this time.  Overall stable for outpatient urology follow-up.   Final Clinical Impression(s) / ED Diagnoses Final diagnoses:  Kidney stone    Rx / DC Orders ED Discharge Orders     None         Regan Lemming, MD 01/08/21 1529

## 2021-01-07 NOTE — ED Triage Notes (Signed)
Recurrent right flank pain , Hx kidney stone 20 yrs ago . Denies urinary symptoms . Nausea

## 2021-01-07 NOTE — ED Notes (Signed)
Patient given urine strainer and specimen cup

## 2021-01-07 NOTE — Discharge Instructions (Addendum)
You were evaluated in the Emergency Department and after careful evaluation, we did not find any emergent condition requiring admission or further testing in the hospital.  Your exam/testing today was overall reassuring.  Your CT scan showed a 4 mm right ureteric vesicular junction kidney stone causing mild to moderate right-sided hydronephrosis which means that the stone is lodged right at the junction between your ureter and bladder and there is some urine backing up into your kidneys.  A stone of 4 mm should pass on its own.  Your urinalysis showed no evidence of urinary tract infection.  Your renal function studies showed a mild elevation in your creatinine which should resolve when the stone passes.  Make sure you are hydrating and drinking plenty of water.  Recommend NSAIDs for pain control.  Follow-up with urology.  Please return to the Emergency Department if you experience any worsening of your condition.  Thank you for allowing [place] to be a part of your care.

## 2021-01-08 ENCOUNTER — Other Ambulatory Visit: Payer: Self-pay | Admitting: Urology

## 2021-01-09 ENCOUNTER — Ambulatory Visit (HOSPITAL_COMMUNITY)
Admission: RE | Admit: 2021-01-09 | Payer: No Typology Code available for payment source | Source: Home / Self Care | Admitting: Urology

## 2021-01-09 ENCOUNTER — Encounter (HOSPITAL_COMMUNITY): Admission: RE | Payer: Self-pay | Source: Home / Self Care

## 2021-01-09 ENCOUNTER — Other Ambulatory Visit: Payer: Self-pay | Admitting: Urology

## 2021-01-09 SURGERY — CYSTOSCOPY/URETEROSCOPY/HOLMIUM LASER/STENT PLACEMENT
Anesthesia: Choice | Laterality: Right

## 2021-01-09 NOTE — Progress Notes (Signed)
Left voicemail to stop ASA, fish oil and omega 3 vits. Today. Need to call back for further instructons
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
## 2021-01-12 ENCOUNTER — Ambulatory Visit (HOSPITAL_BASED_OUTPATIENT_CLINIC_OR_DEPARTMENT_OTHER)
Admission: RE | Admit: 2021-01-12 | Payer: No Typology Code available for payment source | Source: Ambulatory Visit | Admitting: Urology

## 2021-01-12 SURGERY — LITHOTRIPSY, ESWL
Anesthesia: LOCAL | Laterality: Right

## 2021-03-03 ENCOUNTER — Emergency Department (EMERGENCY_DEPARTMENT_HOSPITAL): Payer: Medicare Other

## 2021-03-03 ENCOUNTER — Observation Stay
Admission: EM | Admit: 2021-03-03 | Discharge: 2021-03-04 | Disposition: A | Discharge status: Home / Self Care | Payer: Medicare Other | Attending: Acute Care | Admitting: Acute Care

## 2021-03-03 DIAGNOSIS — Y9389 Activity, other specified: Secondary | ICD-10-CM | POA: Insufficient documentation

## 2021-03-03 DIAGNOSIS — I252 Old myocardial infarction: Secondary | ICD-10-CM | POA: Insufficient documentation

## 2021-03-03 DIAGNOSIS — Y9289 Other specified places as the place of occurrence of the external cause: Secondary | ICD-10-CM | POA: Insufficient documentation

## 2021-03-03 DIAGNOSIS — Y929 Unspecified place or not applicable: Secondary | ICD-10-CM

## 2021-03-03 DIAGNOSIS — R5381 Other malaise: Secondary | ICD-10-CM | POA: Insufficient documentation

## 2021-03-03 DIAGNOSIS — I10 Essential (primary) hypertension: Secondary | ICD-10-CM | POA: Insufficient documentation

## 2021-03-03 DIAGNOSIS — I2510 Atherosclerotic heart disease of native coronary artery without angina pectoris: Secondary | ICD-10-CM | POA: Insufficient documentation

## 2021-03-03 DIAGNOSIS — S62614A Displaced fracture of proximal phalanx of right ring finger, initial encounter for closed fracture: Principal | ICD-10-CM | POA: Insufficient documentation

## 2021-03-03 DIAGNOSIS — E119 Type 2 diabetes mellitus without complications: Secondary | ICD-10-CM | POA: Insufficient documentation

## 2021-03-03 DIAGNOSIS — W228XXA Striking against or struck by other objects, initial encounter: Secondary | ICD-10-CM | POA: Insufficient documentation

## 2021-03-03 DIAGNOSIS — Z8673 Personal history of transient ischemic attack (TIA), and cerebral infarction without residual deficits: Secondary | ICD-10-CM | POA: Insufficient documentation

## 2021-03-03 DIAGNOSIS — S62604A Fracture of unspecified phalanx of right ring finger, initial encounter for closed fracture: Secondary | ICD-10-CM

## 2021-03-03 DIAGNOSIS — Z7982 Long term (current) use of aspirin: Secondary | ICD-10-CM | POA: Insufficient documentation

## 2021-03-03 DIAGNOSIS — Z79899 Other long term (current) drug therapy: Secondary | ICD-10-CM | POA: Insufficient documentation

## 2021-03-03 LAB — CBC WITH DIFFERENTIAL
Basophils %: 1 %
Basophils Abs: 0.1 10*3/uL (ref 0.0–0.5)
Hematocrit: 35.3 % — ABNORMAL LOW (ref 41.0–53.0)
Hemoglobin: 11.7 g/dL — ABNORMAL LOW (ref 13.5–17.5)
Lymphocytes %: 28.8 %
Lymphocytes Abs: 3.1 10*3/uL (ref 1.0–4.8)
MCH: 28.5 pg (ref 27.0–33.0)
MCHC: 33.2 % (ref 32.0–36.0)
MCV: 86 fL (ref 80.0–100.0)
Monocytes %: 8.7 %
Monocytes Abs: 0.9 10*3/uL — ABNORMAL HIGH (ref 0.2–0.4)
Neutrophil Abs: 6.7 10*3/uL (ref 1.8–7.7)
Platelet Estimate, Smear: ADEQUATE — AB
Polys (Segs) %: 61.5 %
RDW: 15 % — ABNORMAL HIGH (ref 0.0–14.7)
Red Blood Cell Count: 4.1 10*6/uL — ABNORMAL LOW (ref 4.50–5.90)
White Blood Cell Count: 10.9 10*3/uL (ref 4.5–11.0)

## 2021-03-03 LAB — BASIC METABOLIC PANEL
Anion Gap: 12 mmol/L (ref 7–15)
Calcium: 9.6 mg/dL (ref 8.6–10.0)
Carbon Dioxide Total: 25 mmol/L (ref 22–29)
Chloride: 100 mmol/L (ref 98–107)
Creatinine Serum: 1.05 mg/dL (ref 0.51–1.17)
E-GFR Creatinine (Male): 78 mL/min/{1.73_m2}
Glucose: 121 mg/dL — ABNORMAL HIGH (ref 74–109)
Potassium: 4.4 mmol/L (ref 3.4–5.1)
Sodium: 137 mmol/L (ref 136–145)
Urea Nitrogen, Blood (BUN): 19 mg/dL (ref 6–20)

## 2021-03-03 MED ORDER — GABAPENTIN 300 MG CAPSULE
300.0000 mg | ORAL_CAPSULE | Freq: Two times a day (BID) | ORAL | Status: DC
Start: 2021-03-04 — End: 2021-03-04
  Administered 2021-03-04: 300 mg via ORAL
  Filled 2021-03-03 (×2): qty 1

## 2021-03-03 MED ORDER — ASPIRIN 81 MG CHEWABLE TABLET
81.0000 mg | CHEWABLE_TABLET | Freq: Every day | ORAL | Status: DC
Start: 2021-03-04 — End: 2021-03-04
  Filled 2021-03-03: qty 1

## 2021-03-03 MED ORDER — MELATONIN 3 MG TABLET
3.0000 mg | ORAL_TABLET | Freq: Every day | ORAL | Status: DC | PRN
Start: 2021-03-03 — End: 2021-03-04

## 2021-03-03 MED ORDER — BISACODYL 10 MG RECTAL SUPPOSITORY
10.0000 mg | RECTAL | Status: DC | PRN
Start: 2021-03-03 — End: 2021-03-04

## 2021-03-03 MED ORDER — LISINOPRIL 10 MG TABLET
10.0000 mg | ORAL_TABLET | Freq: Every day | ORAL | Status: DC
Start: 2021-03-04 — End: 2021-03-04
  Filled 2021-03-03: qty 1

## 2021-03-03 MED ORDER — MAGNESIUM HYDROXIDE 400 MG/5 ML ORAL SUSPENSION
30.0000 mL | Freq: Every day | ORAL | Status: DC | PRN
Start: 2021-03-03 — End: 2021-03-04

## 2021-03-03 MED ORDER — MIRTAZAPINE 15 MG TABLET
30.0000 mg | ORAL_TABLET | Freq: Every day | ORAL | Status: DC
Start: 2021-03-04 — End: 2021-03-04
  Administered 2021-03-04: 30 mg via ORAL
  Filled 2021-03-03: qty 2

## 2021-03-03 MED ORDER — ACETAMINOPHEN 325 MG TABLET
650.0000 mg | ORAL_TABLET | Freq: Four times a day (QID) | ORAL | Status: DC | PRN
Start: 2021-03-03 — End: 2021-03-04

## 2021-03-03 MED ORDER — ATORVASTATIN 40 MG TABLET
80.0000 mg | ORAL_TABLET | Freq: Every day | ORAL | Status: DC
Start: 2021-03-04 — End: 2021-03-04
  Filled 2021-03-03: qty 2
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
## 2021-03-03 NOTE — ED Nursing Note (Signed)
Assuming pt care at this time. Report received from [place]. Pt resting in bed at this time. SW at bedside talking with pt. Pt Aox4, and has even and unlabored respirations. Pt appears in no acute distress at this time.
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
## 2021-03-03 NOTE — ED Nursing Note (Signed)
Pt resting in bed at this time. Pt appears to have even and unlabored respirations. Pt skin warm, pink and dry. Consult at bedside.
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
## 2021-03-04 DIAGNOSIS — R5381 Other malaise: Secondary | ICD-10-CM

## 2021-03-04 DIAGNOSIS — Y9389 Activity, other specified: Secondary | ICD-10-CM

## 2021-03-04 DIAGNOSIS — Y9289 Other specified places as the place of occurrence of the external cause: Secondary | ICD-10-CM

## 2021-03-04 LAB — POC GLUCOSE: POC GLUCOSE: 109 mg/dL — ABNORMAL HIGH (ref 70–99)

## 2021-03-04 MED ORDER — INSULIN ASPART (U-100) 100 UNIT/ML (3 ML) SUBCUTANEOUS PEN
1.0000 [IU] | PEN_INJECTOR | Freq: Three times a day (TID) | SUBCUTANEOUS | Status: DC
Start: 2021-03-04 — End: 2021-03-04

## 2021-03-04 MED ORDER — D10W IV BOLUS - HYPOGLYCEMIA
10.0000 g | INTRAVENOUS | Status: DC | PRN
Start: 2021-03-04 — End: 2021-03-04

## 2021-03-04 MED ORDER — GLUCAGON HCL 1 MG/ML SOLUTION FOR INJECTION
1.0000 mg | INTRAMUSCULAR | Status: DC | PRN
Start: 2021-03-04 — End: 2021-03-04

## 2021-03-04 MED ORDER — GLUCOSE 4 GRAM CHEWABLE TABLET
12.0000 g | CHEWABLE_TABLET | ORAL | Status: DC | PRN
Start: 2021-03-04 — End: 2021-03-04

## 2021-03-04 NOTE — ED Nursing Note (Signed)
Report to Wilkinson, RN who will be assuming pt care at this time.

## 2021-03-04 NOTE — Clinical Case Management (Signed)
Clinical Case Management Documentation    Name: Anthony Gentry  MRN: [id number]   Date of Birth: 12-23-1955 (15yr) Gender: male    Note Date: 03/04/2021 Note Time: 11:25   DISCHARGE PLANNING NOTE  Patient was transferred from Networked reference to record EAF .  Permanent Address: [address] [place] [address]  Discharge Address: Current SNF [facility] [address] [place] [phone]  Patient can follow-up with:   PCP: Sherlon Handing, MD  / Phone Number: [phone]  Preferred Pharmacy:   Funding/Billing: Payor: MEDICARE / Plan: MEDICARE PART A&B   Reason for admission: + deformity, 2-3 wks ago  Patient able to participate in plan?: Yes  Type of Residence: skilled nursing facility  Home Environment (floors/stairs): Patient is a resident at [facility]  Who will be the primary caregiver after discharge?  Phone #? : Patient states current emergency contact is his ex-wife Anthony Gentry [phone], however he has not spoken to her in several years  Pre-Hospitalization self-care deficits: Bathing, Toileting (Patient mobilizes primarily using wheelchair.  He requires assistance for transfers.  Is incontinent of bladder, continent of bowel)  Pre-Hospitalization mobility: Substantial/maximal assistance required (Patient mobilizes primarily using wheelchair.  He requires assistance for transfers.  Is incontinent of bladder, continent of bowel)  Type of home health care services in place: None  DME in place: Manual wheelchair  Patient's goal upon discharge (in patient's own words) : Patient would like to place at a different SNF if possible.  Anticipated Disposition: Long-term SNF  Does the patient have ongoing DC Planning needs?: Yes  Is the patient ready for discharge today?: Yes       Discharge Disposition: Skilled nursing facility  Original 5150 completed within the last 48 hours?: No  MD to MD contact made?: N/A  Chart copied?: N/A  Copies of images requested?: N/A  D/C  Summary: Yes  Ambulance RX: Yes  Admit Orders: N/A  CXR or TB test: N/A  SNF Facility Name: [facility]  Ph: [phone]    [address] [place], [place], [place] [address]  Has facility accepted the patient?: Yes  Facility name: [facility]- new name  Facility address: [address] [place]  Facility phone number: [phone]  Name of accepting MD: Dr. Steva Ready  Transportation needed?: Yes  Mode of transportation: [facility] [phone]  Comments: [facility] to set up transport  The list of accepting post-acute providers/ the pt choice letter and the quality rating information has been provided to the pt/ or designated Management consultant.    Anticipated Discharge Disposition: Long-term SNF    Comments: Patient ok to return per DON and Sarah Admissions , RN to call report, [facility] to set up transport per NP    Follow-up/recommendations:  Date/Time: 03/04/2021 11:25  Electronically Signed by:   Jan Fireman  ED Clinical Case Manager  Pager: [phone]

## 2021-03-04 NOTE — ED Nursing Note (Signed)
Report received from [place] RN for transfer of care, care assumed. Pt resting in bed, appears asleep, respirations are equal and unlabored, NAD noted at this time.

## 2021-03-04 NOTE — ED Nursing Note (Signed)
Pt fourth and third finger buddy taped

## 2021-03-04 NOTE — Discharge Instructions (Addendum)
You were evaluated and treated today for finger swelling and found to have broken finger. You were seen by the oncall plastic surgeon hand specialist with recommendations for a referral and out patient follow up care.     Your xray hand  test today was abnormal--please discuss this result with your Primary Doctor as it may require further testing.  Exam: XR Right Hand   Exam date and time: 03/03/2021 6:35 PM   Age: 66 years old   Clinical indication: 4th finger on right hand deformity      TECHNIQUE:   Imaging protocol: Radiologic exam of the right hand.   Views: 3 or more views.      COMPARISON:   No relevant prior studies available.      FINDINGS:   Bones/joints: Acute mildly displaced oblique fracture through the diaphysis of   the 4th proximal phalanx without definite intra-articular extension.   Soft tissues: Prominent soft tissue swelling of the 4th digit.      IMPRESSION  IMPRESSION:   Acute mildly displaced oblique fracture through the diaphysis of the 4th   proximal phalanx without intra-articular extension. Adjacent prominent soft   tissue swelling.       Follow up with your Primary Doctor in 1-2 days.   If you are not able to arrange timely follow-up with a primary doctor yourself, please call our [place] [place] representative at [phone] during regular business hours for assistance in obtaining a follow-up appointment.    If you would like to establish with a [facility] Doctor call Patient Resources at [phone]    Return to ER for any worsening symptoms, including fevers, worsening pain, persistent vomiting, rash, pain swelling worsening symptoms or you develop any other concerning symptoms.        Thank you for choosing [facility] for your emergency health care needs. It has been our privilege to take care of you today. Your primary complaints have been evaluated based on your history, lab tests, imaging tests and you have been treated for your symptoms and  discharged home. Please take all medicines that are prescribed to you as directed (see below).  It is crucial to follow up with your Primary Doctor in the time frame recommended as many health conditions that seem self-limited initially may actually worsen over time.  If you do not have a primary care physician, we will outline the various resources available for you to find one.    If at any time you feel that your condition is worsening, call your doctor or return to the Emergency Department for reevaluation. CALL 911 IF YOU THINK YOU ARE HAVING A MEDICAL EMERGENCY. Return to the Emergency Department if you are unable to obtain the recommended follow-up treatment or you are not better as expected. You can call the Medical Center with questions at [phone].    Please realize that the results of some studies that you had done during your stay with [place] (such as x-rays and cultures) have only preliminarily results at this time.  Results of these studies may change as more information becomes available or as the studies are re-evaluated by other members of our health care team in the next few days. We will attempt to contact you with any important changes or additions to the studies that were obtained today, particularly if any of these results require a change in your treatment.    GENERAL PAIN MEDICATION PRECAUTIONS    You may have been  prescribed medications for pain today. Some of these may contain a narcotic (Vicodin, Norco, Percocet, etc.) Take these pain medications as prescribed. You also may have been prescribed a muscle relaxant or anxiolytic (benzodiazepine) such as valium (diazepam) or ativan (lorazepam). Do not drink alcohol, drive, or operate heavy machinery while taking either narcotic or benzodiazepine medications. All narcotics and benzodiazepines have a risk of dependence, so please use with caution. If you were prescribed Vicodin, Norco, or Percocet, do not take additional products containing  Tylenol (acetaminophen), as this can cause an acetaminophen overdose and can damage your liver. Do not take more than 4000mg  of acetaminophen daily.    You may also have been prescribed an anti-inflammatory pain medication (NSAID) such as ibuprofen (Motrin, Advil) or naproxen (Aleve). Take the NSAID as prescribed, with food or milk.  Stop taking the NSAID if you develop abdominal pain, vomiting blood or dark/tarry or bloody stools. Be sure to drink plenty of fluids, at least 1-2 liters of water per day while taking an NSAID unless you have congestive heart failure or other condition that requires you to limit your water intake.    You may fill any prescriptions at a pharmacy of your choice. Please take these as directed.      [facility] 24 hour [facility]  [address], [address]      [place] (will fill [facility] Rx after hours)  [address], [facility]   [phone]    [facility]  [address] [place], [place]  [phone]     [facility] (7am-11pm)  [address] [place], [place]   [phone]       [facility] for [facility] Without [facility], [address] [place], [place]  [phone], [address], [address], [place] [phone] (Sunday 8a-2p)

## 2021-03-04 NOTE — ED Nursing Note (Signed)
Report given to [place], RN at [facility].  Pt. Waiting for bls transport to same.

## 2021-03-04 NOTE — ED Nursing Note (Signed)
Covering rest break for primary RN x 45 min.

## 2021-03-04 NOTE — Clinical Case Management (Addendum)
Clinical Case Management Documentation    Name: Anthony Gentry  MRN: [id number]   Date of Birth: 08/13/1955 (64yr) Gender: male    Note Date: 03/04/2021 Note Time: 08:39   INITIAL ASSESSMENT NOTE  Patient was transferred from Networked reference to record EAF .  Permanent Address: [address] [place] [address]  Discharge Address: Current SNF [facility] [address] [place] [phone]  Patient can follow-up with:   PCP: Anthony Handing, MD  / Phone Number: [phone]  Preferred Pharmacy:     Funding/Billing: Payor: MEDICARE / Plan: MEDICARE PART A&B   Secondary Insurance: MediCal  Reason for admission: + deformity, 2-3 wks ago  Patient able to participate in plan?: Yes  Type of Residence: skilled nursing facility  Home Environment (floors/stairs): Patient is a resident at [facility]  Who will be the primary caregiver after discharge?  Phone #? : Patient states current emergency contact is his ex-wife Anthony Gentry [phone], however he has not spoken to her in several years  If 24 hr. supervision is required, who will be the additional support person(s)?  Phone #?: SNF staff provides care  Is caregiver willing/able to assist this patient with daily activities? (feeding, transferring in/out of bed/chair, dressing, bathing & other daily activities?: Yes  Is caregiver willing/able to receive training to support this patient for above activities?: Yes  Pre-Hospitalization self-care deficits: Bathing, Toileting (Patient mobilizes primarily using wheelchair.  He requires assistance for transfers.  Is incontinent of bladder, continent of bowel)  Pre-Hospitalization mobility: Substantial/maximal assistance required (Patient mobilizes primarily using wheelchair.  He requires assistance for transfers.  Is incontinent of bladder, continent of bowel)  Bladder function: Incontinent (Uses briefs for bladder incontinence)  Bowel function: Continent  Pre-Hospital Services:  None  Type of home health care services in place: None  DME in place: Manual wheelchair  DME vendor: N/A  Patient's goal upon discharge (in patient's own words) : Patient would like to place at a different SNF if possible.  Anticipated Preliminary Discharge Disposition: SNF discharge  Does the patient have ongoing DC Planning needs?: Yes  Is the patient ready for discharge today?: No     The list of accepting post-acute providers/ the pt choice letter and the quality rating information has been provided to the pt/ or designated decision maker.    Comments: CM follow up to dispo patient.  2426 Discussed plan of care with NP, attempt to be made to call Ombudsmen and SNF.  [address] [facility] [phone] x3 with phone ringing unanswered. Phoned CMA to obtain alternate phone number to reach admissions.   7989 Called Maralyn Sago [phone] Director of Admissions at [facility]- spoke at length re: custodial status, ability of pt to return back to SNF, addressing pts concerns/ombudsman involvement  and will escalate this to her DON for more clarification. DON to call me back.  [place].Payor NP @ SNF phoned to provide collateral information indicating patient was evaluated for R hand fx 01/06/2021 and was awaiting ortho follow up.  0945 Graciella Belton from SNF to request again for DON call to this writer.  1000 [facility] at [phone], Left message  1045 updated NP  1050 Left message for Maralyn SagoAdmissions [phone]  1053  Called to speake to DON- unable to reach DON nor leave message  1105  Spoke with Maralyn Sago at [facility] who indicates patient has been custodial at facility since October 2022, internal investigation  addressed and staff involved are no longer employed at facility, thus pt returning to his originating SNF will now proceed.  1115 NP Seaters updated, will proceed with dc back to SN.    RN to call report , dc note to follow    [facility]  [address]  [place]  Dr.Sandhu  [phone]    Follow-up/recommendations:    Date/Time: 03/04/2021 08:39  Electronically Signed by:   Jan Fireman  ED Clinical  Case Manager  Pager: [phone]

## 2021-03-04 NOTE — ED Nursing Note (Signed)
Assisting with meal break cover x 30 min.

## 2021-03-04 NOTE — ED Nursing Note (Signed)
Pt discharged from unit with EMS. Report given to EMS, AVS provided, all questions answered. Pt left with all belongings, NAD noted.

## 2021-03-04 NOTE — Progress Notes (Signed)
ED Observation Note    This patient was signed out to me at 0600 by [facility] PA.      ED Observation Unit Re-Evaluation Note    Date: 03/04/2021  Time: 06:34    Latest Vitals  Temp:  (refused) (03/04/21 0600)  Temp src: Oral (03/03/21 1637)  Pulse: 63 (03/04/21 0600)  BP: 100/68 (03/04/21 0600)  Resp: 20 (03/04/21 0600)  SpO2: 99 % (03/04/21 0600)  Weight: (not recorded)    66 yo from SNF to ED for eval of finger deformity plastic surgery consulted    Xray  IMPRESSION:   Acute mildly displaced oblique fracture through the diaphysis of the 4th   proximal phalanx without intra-articular extension. Adjacent prominent soft   tissue swelling.     Plastic surgery recs:  PLAN:  - No indication for surgical intervention at this time given fx is likely old and not functionally limiting patient  - Pt would likely be a poor surgical candidate given his multiple comorbidities   - Pt can follow up in [place] hand clinic PRN if injury becomes bothersome for him to discuss surgical options; referral placed     Subjective:  Denies pain requesting "breakfast, sleep and leave me alone".  ROM intact to right finger    Objective:   General Appearance: awake alert   Musculoskeletal: right 4th finger edema noted fingers buddy taped ROM intact and sensation intact distally     Plan:  ##Finger fracture  -Serial exams  -Plastic surgery consult with recs for out pt referral  -Pain control  -DC planning for SNF placement  -Home medications resumed        DCP RECS:  Follow up in AM with [facility] [phone] to discuss return to SNF vs attempt to secure different SNF bed.     Patient remains clinically stable with stable vital signs. Observation in the emergency department for this condition is still required to determine if the patient needs admission or can be safely discharged home.      ED Observation Discharge Note    Date: 03/04/2021  Time: 11:17    Latest Vitals  Temp: 36.5 C (97.7 F) (03/04/21 0800)  Temp src: Oral  (03/04/21 0800)  Pulse: 70 (03/04/21 0800)  BP: 99/69 (03/04/21 0800)  Resp: 16 (03/04/21 0800)  SpO2: 97 % (03/04/21 0800)  Weight: (not recorded)    Patient re-evaluated and is stable for discharge.      Subjective:  Denies finger pain or sensory changes full ROM intact    Objective:   General Appearance: skin warm, dry, and pink, cooperative  Musculoskeletal: right 4th finger edema ROM intact no sensory deficit     Observation Care Course  EDOU admit plastic surgery consult for finger fracture with recs for out pt clinci follow up referral placed by Plastics provider.  PT seen by DC planning and social work.  Plan of care no surgicla intervention will be followed up in the out pt setting.  DCP contact SNF staff with plan for pt to return to SNF.  Pt is agreeable, pending transport to SNF.         Clinical Impression:     ICD-10-CM    1. Closed displaced fracture of proximal phalanx of right ring finger, initial encounter  S62.614A Hand and Upper Extremity Surgery-Plastic Surgery     PLACE IN ED OBSERVATION     PLACE IN ED OBSERVATION      2. Closed displaced fracture of phalanx of  right ring finger, unspecified phalanx, initial encounter  S62.604A           Observation ended at 11:17 on 03/04/2021    Disposition:   ED Medication Administration through 03/04/2021 1119       Date/Time Order Dose Route Action    03/04/2021 0028 PST Mirtazapine (REMERON) Tablet 30 mg 30 mg ORAL Given    03/04/2021 0028 PST Gabapentin (NEURONTIN) Capsule 300 mg 300 mg ORAL Given          Disposition: Discharge. Follow up with PCP. Plastic surgery referral   ED discharge instructions were reviewed.      Total time I spent in care of this patient today (excluding time spent on other billable services) was 45 minutes. This time does not overlap with other providers involved in this patient's care.        PATIENT'S GENERAL CONDITION:  Good: Vital signs are stable and within normal limits. Patient is conscious and comfortable. Indicators are  excellent.

## 2021-03-04 NOTE — ED Nursing Note (Signed)
Pt has deformity to R fourth finger. Pt denied current pain. Cap refill <3.

## 2021-03-04 NOTE — ED Nursing Note (Signed)
Pt refused to be changed. States do not touch and let me sleep.

## 2021-03-10 ENCOUNTER — Ambulatory Visit: Payer: Medicare Other | Admitting: Medical

## 2021-03-10 VITALS — BP 114/80 | HR 83 | Temp 97.9°F

## 2021-03-10 DIAGNOSIS — M62541 Muscle wasting and atrophy, not elsewhere classified, right hand: Secondary | ICD-10-CM

## 2021-03-10 DIAGNOSIS — S62614A Displaced fracture of proximal phalanx of right ring finger, initial encounter for closed fracture: Secondary | ICD-10-CM

## 2021-03-10 DIAGNOSIS — M62542 Muscle wasting and atrophy, not elsewhere classified, left hand: Secondary | ICD-10-CM
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
## 2021-03-10 NOTE — Nursing Note (Signed)
DOB/Last name verified, Chief Complaint documented, Vitals taken, Pharmacy verified, Screened for pain.    Patient WAS wearing a surgical mask  Droplet precautions were followed when caring for the patient.  PPE used by provider during encounter: Surgical mask    Portia Wisdom, MA

## 2021-03-10 NOTE — Addendum Note (Signed)
Addended by: Rae Mar on: 03/10/2021 06:34 PM     Modules accepted: Orders

## 2021-03-11 ENCOUNTER — Telehealth: Payer: Self-pay | Admitting: Plastic Surgery

## 2021-03-11 NOTE — Telephone Encounter (Signed)
Attempted call to SNF to contact patient but was placed on hold for 15 mins to get his nurse. I hung up and called back but there was no answer.     I will try again to call and schedule surgery with Dr. Alan Mulder.     Kimla Furth [place], [place] III  Outpatient Surgery  [phone] Clinic  [phone] Direct  [phone] Fax

## 2021-03-12 NOTE — Telephone Encounter (Signed)
Left my name & phone number with rep who answered for a call back to schedule surgery. Rep transferred me to nurse but phone just rang again and no one answered.     Unable to contact anyone to schedule surgery.     Kenyan Karnes [place], [place] III  Outpatient Surgery  [phone] Clinic  [phone] Direct  [phone] Fax

## 2021-03-20 NOTE — Anesthesia Preprocedure Evaluation (Addendum)
Anesthesia Evaluation      Medical History, Review of Systems, and Physical Exam    ROS includes input from PREP Clinic Nurse  [id number] Pas is a 53yr y/o male brought from SNF for R hand deformity presenting for closed reduction with percuatenous pinning and ORIF with Dr. Anastasio Auerbach.     BMI: 22.9 (76.5 kg, 6')    PMH: HTN, DM2, stroke, CAD, chronic diabetic L foot ulcer.    PSH: Hx ankle ORIF     Pertinent Allergies: NKDA    Pertinent medications:  Acetaminophen, citalopram, gabapentin, Mirtazapine  ASA, Atorvastatin, lisinopril  Sitagliptin-metformin    Labs 03/03/21:  CMP unremarkable   CBC unremarkable except baseline hb 11.7    NPO: Meets guidelines      Unable to perform ROSAnesthesia History comments:No airway hx    No history of anesthetic complications    Airway   Mallampati: IV  TM distance: >3 FB   Comment:  Small mouth opening  Neck ROM is full.     Dental    (+) edentulous       Pulmonary - normal exam  (-) sleep apnea    Patient's breath sounds clear to auscultation. Cardiovascular - cardiovascular exam normal  (+) hypertension (),  CAD,   (-) murmur    Rhythm: regular  Rate: normal  Patient has good exercise tolerance. (Walks to front of SNF building 3x/day without CP or SOB)   Neuro/Psych    (+) CVA (residual sx of urinary retention)  GI/Hepatic/Renal         Abdominal - abdominal exam normal  (-) obese   Endo/Other    (+) diabetes mellitus ( chronic diabetic foot ulcer -LEFT) well controlled type 2,   Smoking History    (-) current smoker           Anesthesia exam other findings: Left eye lid lag, baseline slow response to questions. Normal LUE strength.                       Anesthesia Plan    ASA 3     general and regional   (1. Access: PIV x2  2. Blood: T+S  3. Monitors: Standard ASA, sed line  4. Airway: ETT/DL  5. Premed: Versed  6. Induction: Standard (prop, roc, fentanyl, lidocaine)  7. Maintenance: Balanced anesthesia with inhaled anesthetics   8. Emergence:  Standard  9. PostOp pain:  Dilaudid, fentanyl, tylenol   10. Special: PONV prophylaxis - decadron, ondansetron  )  Patient is not a current smoker.    Intravenous induction  Trial extubation is planned.    Anesthetic plan and risks discussed with patient.  Resident/Fellow/CRNA discussed the plan with the attending.    I performed the pre-anesthetic exam and prescribed the anesthesia plan.  I reviewed the available note and agree with the documentation.    This patient was seen, evaluated, and care plan was developed with the resident  before induction .  I agree with the assessment and plan as outlined in the resident's note.   Electronically signed @ 03/25/2021  10:04 by:  Hannah Beat (MD )PI [id number]   Assistant Clinical Professor  Department of Anesthesia and Pain Medicine

## 2021-03-23 ENCOUNTER — Encounter: Payer: Self-pay | Admitting: Plastic Surgery

## 2021-03-23 NOTE — Pre-Op/Pre-Procedure Screening (Signed)
Preoperative Phone Call Documentation    Scheduled Surgery Date: 03/25/2021  Proposed Surgery: Procedure(s) with comments:  CLOSED REDUCTION, FRACTURE, DIGIT, HAND, WITH PERCUTANEOUS PINNING (Right)  ORIF, HAND (Right) - right ring finger orif  Pre-op Dx: Displaced fracture of proximal phalanx of right ring finger, initial encounter for closed fracture [S62.614A]  Surgeon: Surgeon(s):  Alcide Evener, MD      Patient confirmed receipt of pre op instructions.    Verification of planned surgery times  Surgery Date/Time Verified: Yes (03/25/21 0921)  Arrival Time Verified: 0715  [facility] Location Verified[facility]    Fasting Instructions:  Time of Last Clear Liquids: 0715  Date of Last Clear Liquids: 03/25/21  Time of Last Solids : 0000  Date of Last Solids: 03/25/21     Post surgical discharge information   Person to pick up pt (if different than support person): Will provide on DOS    Infectious Disease Screen:   (Questionnaire Updated 09/02/20)    In the last 10 days, have you tested positive for the COVID-19 virus or been diagnosed with COVID-19? no    2. Over the past 7 days, have you had close contact with someone who is  sick from COVID-19? no    3.  Over the past 2 weeks, have you had  new symptoms not related to chronic illness of the following signs/symptoms: Fever >100 F(37.8 C), chills, muscle pain, headache, abdominal pain, diarrhea, nausea, vomiting, loss of taste/smell, or respiratory symptoms such as a cough and shortness of breath? no    4. Over the past 3 weeks, have you had new signs of rash with swollen lymph nodes, fever, and chills or been diagnosed with monkeypox virus infection? no      Medications Rec with Chom, LVN at [facility]:  Medications to take SHF:WYOVZCHYIF, Gabapentin.   Medications to HOLD on DOS: Lisinopril, Janumet, Twocal.  ASA: Per Surgeon's office (Dr. Alan Mulder). Procedure pass message sent to Dr. Alan Mulder and April Fordham to provide medication  instructions to [facility].     Per [facility]:        Instructions for Day of Surgery      Bring a valid photo  ID.  Do not smoke or drink alcohol.  DO NOT bring valuables  including jewelry and body piercing with you on the day of surgery.  Take a bath or shower on the morning of surgery.     DO NOT apply cosmetics, deodorant,creams or lotion after bathing.  Wear comfortable clothing that either zips or buttons in front.  Arrange for transportation to bring you home after your surgery and someone to stay with at home.  Your surgery will be cancelled if you do not have adequate transportation.  Bring your CPAP machine for sleep apnea if applicable.    Fasting Guidelines for Adults      NO solid food including broth and dairy products after the cut off time for solid food (See above documentation).   On the Morning of Surgery:  Clear liquids may be continued until 2 hours prior to surgery. See above documentation for cut off time for clear liquids.   Clear liquids include water, apple juice, yellow gatorade, clear carbonated beverages, black coffee or tea without cream, sweeteners, or milk. No alcohol containing beverages.    Location for Check-In on Day of Surgery    [facility] at [address] [place], [place] [address]  Main Lobby Admissions Office - [facility] (Room [phone])    For adult patients:      Patient was provided with phone number [phone] if there are changes to their health or if new symptoms arise that may potentially cancel the case.    We will allow 2 consistent adult visitors on the appointment date. Visitors to the medical center will no longer be required to show proof of vaccination for COVID-19 or a negative test to enter the facilities. Visitors will continue to be screened for influenza-like illnesses (ILI), and they will still need to check in with Guest Relations to receive their visitor badge    All visitors must consistently wear a mask while in the hospital  zone. Visitors will not be allowed if they have signs and symptoms of illness, including cough, runny nose, sneezing, fever or sore throat, or have been around someone with COVID- 19. Children less than 93 years old will not be allowed in the hospital premises per hospital policy. Visiting hours are between 9 a.m. and 9 p.m.    Only those patients who are experiencing any cold, flu or COVID-like symptoms will need to complete a home COVID test and report the results back to [place].      Patient verbalized understanding.      Cleophus Molt, RN  03/23/21  17:04

## 2021-03-23 NOTE — Pre-Op/Pre-Procedure Screening (Signed)
Preoperative Phone Call Documentation    Scheduled Surgery Date: 03/25/2021  Proposed Surgery: Procedure(s) with comments:  CLOSED REDUCTION, FRACTURE, DIGIT, HAND, WITH PERCUTANEOUS PINNING (Right)  ORIF, HAND (Right) - right ring finger orif  Pre-op Dx: Displaced fracture of proximal phalanx of right ring finger, initial encounter for closed fracture [S62.614A]  Surgeon: Surgeon(s):  Alcide Evener, MD      Patient confirmed receipt of pre op instructions.    Verification of planned surgery times  Surgery Date/Time Verified: Yes (03/25/21 0921)  Arrival Time Verified: 0715  Surgery Check-In Location Verified: [facility]    Fasting Instructions:  Time of Last Clear Liquids: 0715  Date of Last Clear Liquids: 03/25/21  Time of Last Solids : 0000  Date of Last Solids: 03/25/21     Post surgical discharge information   Person to pick up pt (if different than support person): Will provide on DOS      Instructions for Day of Surgery      Bring a valid photo  ID.  Do not smoke or drink alcohol.  DO NOT bring valuables  including jewelry and body piercing with you on the day of surgery.  Take a bath or shower on the morning of surgery.     DO NOT apply cosmetics, deodorant,creams or lotion after bathing.  Wear comfortable clothing that either zips or buttons in front.  Arrange for transportation to bring you home after your surgery and someone to stay with at home.  Your surgery will be cancelled if you do not have adequate transportation.  Bring your CPAP machine for sleep apnea if applicable.    Fasting Guidelines for Adults      NO solid food including broth and dairy products after the cut off time for solid food (See above documentation).   On the Morning of Surgery:  Clear liquids may be continued until 2 hours prior to surgery. See above documentation for cut off time for clear liquids.   Clear liquids include water, apple juice, yellow gatorade, clear carbonated beverages, black coffee or tea  without cream, sweeteners, or milk. No alcohol containing beverages.    Location for Check-In on Day of Surgery    [facility] at [address] [place], [place] [address]              Main Lobby Admissions Office - [facility] (Room [phone])    For adult patients:      Patient was provided with phone number [phone] if there are changes to their health or if new symptoms arise that may potentially cancel the case.    We will allow 2 consistent adult visitors on the appointment date. Visitors to the medical center will no longer be required to show proof of vaccination for COVID-19 or a negative test to enter the facilities. Visitors will continue to be screened for influenza-like illnesses (ILI), and they will still need to check in with Guest Relations to receive their visitor badge    All visitors must consistently wear a mask while in the hospital zone. Visitors will not be allowed if they have signs and symptoms of illness, including cough, runny nose, sneezing, fever or sore throat, or have been around someone with COVID- 19. Children less than 73 years old will not be allowed in the hospital premises per hospital policy. Visiting hours are between 9 a.m. and 9 p.m.    Only those patients who are experiencing any cold, flu or COVID-like symptoms will need to complete  a home COVID test and report the results back to [place].    Social Worker verbalized understanding.      Cleophus Molt, RN  03/23/21  16:21

## 2021-03-25 ENCOUNTER — Other Ambulatory Visit: Payer: Self-pay

## 2021-03-25 ENCOUNTER — Ambulatory Visit
Admission: RE | Admit: 2021-03-25 | Discharge: 2021-03-25 | Disposition: A | Discharge status: Home / Self Care | Payer: Medicare Other | Attending: Plastic Surgery | Admitting: Plastic Surgery

## 2021-03-25 ENCOUNTER — Encounter: Payer: Self-pay | Admitting: Plastic Surgery

## 2021-03-25 ENCOUNTER — Ambulatory Visit (HOSPITAL_BASED_OUTPATIENT_CLINIC_OR_DEPARTMENT_OTHER): Payer: Medicare Other

## 2021-03-25 ENCOUNTER — Ambulatory Visit: Payer: Medicare Other

## 2021-03-25 ENCOUNTER — Encounter
Admission: RE | Disposition: A | Discharge status: Home Health | Payer: Self-pay | Source: Ambulatory Visit | Attending: Plastic Surgery

## 2021-03-25 DIAGNOSIS — E119 Type 2 diabetes mellitus without complications: Secondary | ICD-10-CM

## 2021-03-25 DIAGNOSIS — I2510 Atherosclerotic heart disease of native coronary artery without angina pectoris: Secondary | ICD-10-CM

## 2021-03-25 DIAGNOSIS — S62614A Displaced fracture of proximal phalanx of right ring finger, initial encounter for closed fracture: Secondary | ICD-10-CM

## 2021-03-25 DIAGNOSIS — W500XXA Accidental hit or strike by another person, initial encounter: Secondary | ICD-10-CM | POA: Insufficient documentation

## 2021-03-25 DIAGNOSIS — S62614P Displaced fracture of proximal phalanx of right ring finger, subsequent encounter for fracture with malunion: Secondary | ICD-10-CM

## 2021-03-25 DIAGNOSIS — I10 Essential (primary) hypertension: Secondary | ICD-10-CM

## 2021-03-25 LAB — POC GLUCOSE
POC GLUCOSE: 99 mg/dL (ref 70–99)
POC GLUCOSE: 102 mg/dL — ABNORMAL HIGH (ref 70–99)

## 2021-03-25 LAB — BLOOD TYPE VERIFICATION: Patient Blood Type: O POS

## 2021-03-25 LAB — TYPE AND SCREEN
Ab Scrn-Gel: NEGATIVE
Patient Blood Type: O POS

## 2021-03-25 SURGERY — ORIF, HAND
Anesthesia: Regional | Site: Hand | Laterality: Right | Wound class: Clean

## 2021-03-25 MED ORDER — SUGAMMADEX 100 MG/ML INTRAVENOUS SOLUTION
INTRAVENOUS | Status: DC | PRN
Start: 2021-03-25 — End: 2021-03-25
  Administered 2021-03-25 (×3): 200 mg via INTRAVENOUS

## 2021-03-25 MED ORDER — BUPIVACAINE (PF) 0.5 % (5 MG/ML) INJECTION SOLUTION
INTRAMUSCULAR | Status: AC
Start: 2021-03-25 — End: 2021-03-25
  Filled 2021-03-25: qty 30

## 2021-03-25 MED ORDER — FENTANYL (PF) 50 MCG/ML INJECTION SOLUTION
25.0000 ug | INTRAMUSCULAR | Status: DC | PRN
Start: 2021-03-25 — End: 2021-03-25

## 2021-03-25 MED ORDER — ONDANSETRON HCL (PF) 4 MG/2 ML INJECTION SOLUTION
INTRAMUSCULAR | Status: DC | PRN
Start: 2021-03-25 — End: 2021-03-25
  Administered 2021-03-25: 4 mg via INTRAVENOUS

## 2021-03-25 MED ORDER — LACTATED RINGERS IV INFUSION
INTRAVENOUS | Status: DC
Start: 2021-03-25 — End: 2021-03-25

## 2021-03-25 MED ORDER — NALOXONE 4 MG/ACTUATION NASAL SPRAY
NASAL | 0 refills | Status: AC
Start: 2021-03-25 — End: 2022-03-25
  Filled 2021-03-25: qty 2, 1d supply, fill #0

## 2021-03-25 MED ORDER — PROPOFOL 10 MG/ML INTRAVENOUS EMULSION
INTRAVENOUS | Status: AC
Start: 2021-03-25 — End: 2021-03-25
  Filled 2021-03-25: qty 20

## 2021-03-25 MED ORDER — ONDANSETRON HCL (PF) 4 MG/2 ML INJECTION SOLUTION
4.0000 mg | INTRAMUSCULAR | Status: DC | PRN
Start: 2021-03-25 — End: 2021-03-25

## 2021-03-25 MED ORDER — INTRAOP PROPOFOL INJ 20 ML VIAL (BOLUS+INFUSION)
Status: DC | PRN
Start: 2021-03-25 — End: 2021-03-25
  Administered 2021-03-25 (×2): 50 mg via INTRAVENOUS
  Administered 2021-03-25: 90 mg via INTRAVENOUS

## 2021-03-25 MED ORDER — INTRAOP FENTANYL 50 MCG/ML INJ 2 ML AMP
Status: DC | PRN
Start: 2021-03-25 — End: 2021-03-25
  Administered 2021-03-25: 50 ug via INTRAVENOUS
  Administered 2021-03-25: 25 ug via INTRAVENOUS

## 2021-03-25 MED ORDER — LIDOCAINE HCL 10 MG/ML (1 %) INJECTION SOLUTION
0.1000 mL | INTRAMUSCULAR | Status: DC | PRN
Start: 2021-03-25 — End: 2021-03-25

## 2021-03-25 MED ORDER — FENTANYL (PF) 50 MCG/ML INJECTION SOLUTION
INTRAMUSCULAR | Status: AC
Start: 2021-03-25 — End: 2021-03-25
  Filled 2021-03-25: qty 2

## 2021-03-25 MED ORDER — BUPIVACAINE (PF) 0.5 % (5 MG/ML) INJECTION SOLUTION
INTRAMUSCULAR | Status: DC | PRN
Start: 2021-03-25 — End: 2021-03-25
  Administered 2021-03-25: 10 mL

## 2021-03-25 MED ORDER — BUPIVACAINE (PF) 0.25 % (2.5 MG/ML) INJECTION SOLUTION
INTRAMUSCULAR | Status: AC
Start: 2021-03-25 — End: 2021-03-25
  Filled 2021-03-25: qty 30

## 2021-03-25 MED ORDER — METOCLOPRAMIDE 5 MG/ML INJECTION SOLUTION
10.0000 mg | INTRAMUSCULAR | Status: DC | PRN
Start: 2021-03-25 — End: 2021-03-25

## 2021-03-25 MED ORDER — INTRAOP DEXAMETHASONE 4 MG/ML INJ 5 ML VIAL
Status: DC | PRN
Start: 2021-03-25 — End: 2021-03-25
  Administered 2021-03-25: 8 mg via INTRAVENOUS

## 2021-03-25 MED ORDER — PHENYLEPHRINE 1 MG/10 ML (100 MCG/ML) IN 0.9 % SOD.CHLORIDE IV SYRINGE
INJECTION | INTRAVENOUS | Status: DC | PRN
Start: 2021-03-25 — End: 2021-03-25
  Administered 2021-03-25: 100 ug via INTRAVENOUS

## 2021-03-25 MED ORDER — CHLORHEXIDINE GLUCONATE 0.12 % MOUTHWASH
15.0000 mL | MOUTHWASH | Status: DC
Start: 2021-03-25 — End: 2021-03-25

## 2021-03-25 MED ORDER — HYDROMORPHONE 1 MG/ML INJECTION SYRINGE
0.2000 mg | INJECTION | INTRAMUSCULAR | Status: DC | PRN
Start: 2021-03-25 — End: 2021-03-25

## 2021-03-25 MED ORDER — INTRAOP NACL 0.9% 1000 ML IRRIGATION BOTTLE
Status: DC | PRN
Start: 2021-03-25 — End: 2021-03-25
  Administered 2021-03-25: 1000 mL

## 2021-03-25 MED ORDER — LACTATED RINGERS IV INFUSION
INTRAVENOUS | Status: DC | PRN
Start: 2021-03-25 — End: 2021-03-25

## 2021-03-25 MED ORDER — CEFAZOLIN 500 MG SOLUTION FOR INJECTION
INTRAMUSCULAR | Status: DC | PRN
Start: 2021-03-25 — End: 2021-03-25
  Administered 2021-03-25: 2000 mg via INTRAVENOUS

## 2021-03-25 MED ORDER — LIDOCAINE HCL 20 MG/ML (2 %) INJECTION SOLUTION
INTRAMUSCULAR | Status: DC | PRN
Start: 2021-03-25 — End: 2021-03-25
  Administered 2021-03-25: 80 mg via INTRAVENOUS
  Administered 2021-03-25: 20 mg via INTRAVENOUS

## 2021-03-25 MED ORDER — MIDAZOLAM (PF) 1 MG/ML INJECTION SOLUTION
INTRAMUSCULAR | Status: AC
Start: 2021-03-25 — End: 2021-03-25
  Filled 2021-03-25: qty 2

## 2021-03-25 MED ORDER — OXYCODONE 5 MG TABLET
2.5000 mg | ORAL_TABLET | ORAL | 0 refills | Status: AC | PRN
Start: 2021-03-25 — End: 2021-04-01
  Filled 2021-03-25: qty 5, 2d supply, fill #0

## 2021-03-25 MED ORDER — EPHEDRINE SULFATE 5 MG/ML INTRAVENOUS SOLUTION
INTRAVENOUS | Status: DC | PRN
Start: 2021-03-25 — End: 2021-03-25
  Administered 2021-03-25 (×3): 5 mg via INTRAVENOUS

## 2021-03-25 MED ORDER — ACETAMINOPHEN 1,000 MG/100 ML (10 MG/ML) INTRAVENOUS SOLUTION
INTRAVENOUS | Status: DC | PRN
Start: 2021-03-25 — End: 2021-03-25
  Administered 2021-03-25: 1000 mg via INTRAVENOUS

## 2021-03-25 MED ORDER — INTRAOP ROCURONIUM 10 MG/ML INJ 5 ML VIAL
Status: DC | PRN
Start: 2021-03-25 — End: 2021-03-25
  Administered 2021-03-25: 70 mg via INTRAVENOUS

## 2021-03-25 SURGICAL SUPPLY — 34 items
BLADE KNIFE 15 (Blade) ×2 IMPLANT
CAST PLASTER SPLINT 5 X 30IN (Dressing) ×2 IMPLANT
CAST PLASTER SPLINT 5 X 30IN SHORT POSTERIOR LEG (Dressing) ×2 IMPLANT
CAUTERY BIPOLAR CORD 12FT (Cautery) ×2 IMPLANT
CAUTERY BIPOLAR FORCEPS 0.5MM X 4IN JEWELER TIP GREEN DISPOSABLE (Disp Inst) ×2 IMPLANT
COVER LIGHT HANDLE STERIS (Drape) ×6 IMPLANT
DRAPE MAYO STAND COVER XLARGE LATEX FREE (Drape) ×2 IMPLANT
DRAPE MINI C ARM (Drape) ×2 IMPLANT
DRAPE STERI 17 X 11 3M 1000 (Drape) ×2 IMPLANT
DRAPE TOWEL CLOTH 17 X 27IN STERILE BLUE 4 PACK (Drape) ×4 IMPLANT
DRESSING COBAN 4IN X 5YD LATEX FREE STERILE (Dressing) ×2 IMPLANT
DRESSING GAUZE AVANT 75 X 2IN PRM25496 (Dressing) IMPLANT
DRESSING HAND SZABO (Dressing) ×2 IMPLANT
DRESSING WEBRIL 4IN STERILE (Dressing) ×2 IMPLANT
DRESSING WEBRIL 4IN X 4YD UNSTERILE (Dressing) IMPLANT
DRESSING XEROFORM GAUZE 1 X 8IN PLAIN PETROLATUM GAUZE (Dressing) ×2 IMPLANT
ELECTRODE BLADE INSULATED 2.75IN (Cautery) ×2 IMPLANT
GLOVES BIOGEL 7 1/2 TOP GLOVE LATEX (Glove) ×2 IMPLANT
HEADREST MULTI RING 9IN X 3-7IN (Pad) ×2 IMPLANT
INACTIVE - DRESSING HAND SZABO (Dressing) ×1 IMPLANT
INFRAME IMPLANT 2.0MM X 38MM (Nail) ×2 IMPLANT
INFRAME INSTRUMENT FOR 2.0MM (Other) ×2 IMPLANT
INSTRUMENT FRAZIER SUCTION TIP 12FR (Suction) ×2 IMPLANT
INSTRUMENT FRAZIER SUCTION TIP 8FR (Suction) ×2 IMPLANT
PACK HAND LATEX SAFE (Pack) ×2 IMPLANT
PAD HEEL PROTECTOR BOOT 9 X 14 X 1.5IN EGGCRATE (Pad) ×4 IMPLANT
PIN COVER SMALL YELLOW (Pin) IMPLANT
PREP CHLORAPREP 26ML WITH TINT ~~LOC~~ (Prep) ×4 IMPLANT
SCD SLEEVE CALF REG 18IN ALP 1 (Sleeve) ×2 IMPLANT
STRYKER BLADE LONG NARROW 25 X 5.5 2296-003-414 (Blade) ×2 IMPLANT
SUTURE ETHILON 4-0 FS-2 18IN BLACK (Suture) ×2 IMPLANT
SUTURE ETHILON 4-0 P-3 18IN BLACK (Suture) ×2 IMPLANT
TAPE CLOTH 1IN ADHESIVE POROUS (Tape) IMPLANT
VESSEL LOOP MINI 0.8 X 406MM RED (Device) IMPLANT

## 2021-03-25 NOTE — H&P (Signed)
Plastic Surgery Pre Op H&P Update Note  Date: 03/25/21    Subjective:  Patient was seen and evaluated pre-operatively. There have been no significant changes since the prior H&P. Patient reports feeling well. Denies fever, cough, shortness of breath or chest pain.     Physical Exam:     GENERAL: frail-appearing, in no apparent distress  PULM: Normal work of breathing on RA  RUE MSK: Abnormal cascade with right RF malrotated at rest and scissoring with attempted fist formation. SILT in m/r/u nerve distribution. Fires EPL/FPL/interosseous. Fingers WWP.     A/P: Anthony Gentry is a 66yr old right hand dominant retired male with multiple medical comorbidities who presents for elective CRPP vs ORIF of right RF proximal phalanx fracture.    - The patient was given the opportunity to ask any further questions.  - Pre-op orders have been entered and consent has been reviewed.  - We will plan to proceed with the aforementioned procedure     Jewel Baize, MD  Plastic & Reconstructive Surgery, PGY-2  PRS Service Pager: [phone]   Attending Attestation:  I was present during the above encounter and agree with the examination findings and management plan except as otherwise noted/documented.  Beverley Fiedler, MD  03/25/2021  17:22

## 2021-03-25 NOTE — Anesthesia Procedure Notes (Addendum)
ANESTHESIA AIRWAY  Date/Time: 03/25/2021 9:41 AM    Staff    Patient location:  [facility]  [place], MD.  Performing Resident/CRNA: Barbaraann Cao, MD        Indications and Patient Condition    Spontaneous Ventilation: absent  Sedation level: level 0: deep/analgesia  Preoxygenated: yes  Patient position: sniffing  MILS not maintained throughout  Mask difficulty assessment: 2 - vent by mask + OA or adjuvant +/- NMBA    Final Airway Details    Final airway type: endotracheal airway      Successful airway: cuffed ETT     Successful intubation technique: video laryngoscopy  Facilitating devices/methods: intubating stylet and cricoid pressure  Endotracheal tube insertion site: oral  Blade: Macintosh  Blade size: #4  ETT size: 7.5 mm  Cormack-Lehane Classification: grade IIa - partial view of glottis  Placement verified by: chest auscultation and capnometry   Measured from: teeth and lips  Secured at 22 cm.  Number of attempts at approach: 1  Ventilation between attempts: none  Number of other approaches attempted: 0    Additional Comments  Opted for glide given small mouth opening and anterior appearing airway. Grade 2b view on glide. Anterior larynx but improved with cricoid pressure, able to place ETT on 1 attempt. Atraumatic airway.

## 2021-03-25 NOTE — Nurse Assessment (Signed)
PACU ADMIT NURSING NOTE    Note Started: 03/25/2021, 11:36     Received patient from OR at 1111 hours via gurney.  Monitor and Alarms on.  Patient sleepy but arousable. Otis Peak, RN

## 2021-03-25 NOTE — Nurse Discharge Note (Signed)
Discharge to snf via wheelchair to private auto at [facility].   Belongings with patient.  See EMR for assessment.  Otis Peak, RN

## 2021-03-25 NOTE — Discharge Instructions (Addendum)
HAND SURGERY DISCHARGE INSTRUCTIONS  03/25/2021    PROCEDURE: Surgical fixation of right ring finger fracture  SURGEON: Dr. Patric Dykes      GENERAL:    It is normal to have swelling, pain, and some bruising in the immediate post-operative period.    Keep hand or arm elevated above your heart almost all the time for the next 48 hours, and most of the time for the next 5 days to reduce swelling and throbbing pain. One way to achieve this is by putting the injured hand on the opposite shoulder. While in a chair or lying in bed, place you hand and arm on pillows to keep it elevated.    Ice over the splint or dressing for 20 minutes as often as every 2 hours while awake for the next 48 hours to reduce swelling and throbbing pain.     Gently move (straighten and bend) the fingers not included in your splint or dressing 20 times, morning and evening to reduce stiffness.   You may do light activities of daily living, such as feeding, key boarding, etc. No power grip until instructed otherwise.      Keep the splint or dressing clean and dry and do not remove until seen in clinic to protect your surgical incision.  You may shower but must protect the splint or dressing from getting wet. We recommend using a large plastic bag secured to the arm with rubber bands or tape above the splint to keep the bag in place without leaking.    SMOKING interferes with healing and makes painful problems more painful. Problems after surgery are more likely if you smoke, especially if keep smoking after surgery. DON'T SMOKE!    HOW TO DEAL WITH PAIN AFTER SURGERY:    If numbing medicine was administered during the procedure it may be normal to feel numbness in the hand or arm up to the day after surgery.  Consider taking a prescribed pain medicine before sleep the first night after surgery to avoid waking up in severe pain if the numbness wears off during sleep.    Do FIRST: Tylenol (Acetaminophen) and/or Motrin Ibuprofen)     The typical  recommended doses for this strategy are 500 or 650 mg for Tylenol and 600 mg for Motrin. These over-the-counter pain medications act differently to bring you relief and can be used every six hours in an alternating fashion to ensure you have something to help with pain every three hours as illustrated below. LIMIT ACETAMINOPHEN (TYLENOL) TO LESS THAN 3000 MG OVER 24 HOURS.    Do SECOND: Oxycodone     This prescribed narcotic medication should be used as an "escape hatch" only when necessary to alleviate pain experienced despite the Tylenol/Motrin regimen described above.      If you are using a narcotic medication, we recommend using Miralax (1 packet with large glass of water) twice per day to prevent constipation.    PLEASE WATCH FOR ANY OF THE FOLLOWING PROBLEMS:    PAIN that increases in Intensity despite your pain medication.  SWELLING of the fingers THAT IS NOT RELIEVED BY CONTINUOUS ELEVATION  NUMBNESS or  TINGLING  in the fingers that persists for more 24 hours after the surgery and was not present prior to the surgery  REDNESS and warmth increasing over the finger(s)/ hand or spreading up the arm.  BLUE or  WHITE finger tips instead of normal or pink color.  If a cast or SPLINT BREAKS  Or  COMES OFF. (especially in [place])    *If you should notice any of these problems please contact the [facility] at [phone] during office hours (M-F; 8:30-430pm).    *At night or during weekends, please return to the Emergency Department at [facility] or your nearest Emergency Department for evaluation.    SPECIFIC:  Please leave dressings intact, clean, and dry on until seen in the first follow-up clinic.    FOLLOW UP APPOINTMENT:    Your follow-up will be at [facility], [id number], [place], [place]) with Dr. Patric Dykes.    Future Appointments   Date Time Provider [place]   04/07/2021  2:00 PM HAND PLASTICS PA [facility]         You  were given acetaminophen (Tylenol) today; please wait 6 hours before giving any medication containing acetaminophen (Tylenol) until after 4:45 pm. Please read active ingredients in all medications.    Discharge instructions: Post Anesthesia  1. It is normal to feel dizzy and sleepy for many hours after your surgery.  Do not drive a car, work any equipment, sign important papers, or make big decisions until the next day.  2. Diet:  Begin with clear liquids (apple juice, clear broth, ginger ale) and slowly return to what you normally eat.  No alcoholic drinks for 24 hours.  3. Activities:  Rest the day of surgery.  The day after surgery, you may do whatever you feel able to do, unless otherwise directed by your physician.     For Surgical problems, report to [facility] at [phone].  If you are unable to access services, call the [facility] Operator at [phone] and ask for the Physician on call for: Plastic Surgery

## 2021-03-25 NOTE — Anesthesia Postprocedure Evaluation (Signed)
Patient: Anthony Gentry    ORIF, HAND    Anesthesia Type: general, regional    Stop Bang: 3    Vital Signs (Last Recorded):  BP: (!) 151/96 (03/25/21 1145)  Pulse: 75 (03/25/21 1145)  Resp: 11 (03/25/21 1145)  Temp Max: 36.2 C (97.2 F)  (Last 24 hours)  Temp: 36.2 C (97.2 F) (03/25/21 1145)  SpO2: 95 % (03/25/21 1145) on    Device (Oxygen Therapy): room air (03/25/21 1119)      Anesthesia Post Evaluation    Procedure: Procedure(s):ORIF, HAND  [facility]  Anesthesia: Regional, General    Patient location during evaluation: PACU  Patient participation: complete - patient participated  Level of consciousness: sleepy but conscious  Pain score: 0  Pain management: adequate  Multimodal analgesia pain management approach    Patient did not have a perioperative block    Airway patency: patent  Two or more strategies used to mitigate risk of obstructive sleep apnea  Anesthetic complications: no  Cardiovascular status: blood pressure returned to baseline and stable  Respiratory status: room air  Hydration status: euvolemic  Nausea and Vomiting: absent    Comments: [name] is a 1yr male s/p ORIF, HAND. Modified Aldrete score 10/10.  Received no medications in PACU. Pain under adequate control. No nausea.  Tolerating PO. VSS. Patient is safe to discharge from PACU back to SNF.                     [name], MD    Electronically signed @ 03/25/2021  13:40 by:  Stark Falls (MD )PI [phone]   Assistant Clinical Professor  Department of Anesthesia and Pain Medicine
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
## 2021-03-25 NOTE — Nurse Assessment (Signed)
Pre-Op Admit Nursing Note  Note started on 03/25/2021 at 08:05    Received patient from as a direct admit at 0805 hours via wheelchair.  Patient status awake and alert.  Oriented to room and unit.  Admission assessment and care plan initiated. PIN card provided to patient   Neal Dy, RN

## 2021-03-25 NOTE — Nurse Focus (Signed)
This nurse spoke with Designer, industrial/product at [facility]. Provided discharge information, and ETA of arrival.  Hillary Bow, [place]

## 2021-04-04 DEATH — deceased

## 2021-04-07 ENCOUNTER — Ambulatory Visit: Payer: Medicare Other | Admitting: Medical
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
## 2021-04-14 ENCOUNTER — Ambulatory Visit: Payer: Medicare Other

## 2021-08-18 ENCOUNTER — Encounter: Payer: Self-pay | Admitting: Neurology
